# Patient Record
Sex: Female | Born: 1965 | Race: White | Hispanic: No | Marital: Married | Smoking: Never smoker
Health system: Southern US, Academic
[De-identification: ages and names within clinical notes are randomized; demographics above are authoritative.]

## PROBLEM LIST (undated history)

## (undated) DIAGNOSIS — R7303 Prediabetes: Secondary | ICD-10-CM

## (undated) DIAGNOSIS — Z464 Encounter for fitting and adjustment of orthodontic device: Secondary | ICD-10-CM

## (undated) DIAGNOSIS — IMO0002 Reserved for concepts with insufficient information to code with codable children: Secondary | ICD-10-CM

## (undated) DIAGNOSIS — R29810 Facial weakness: Secondary | ICD-10-CM

## (undated) DIAGNOSIS — G4730 Sleep apnea, unspecified: Secondary | ICD-10-CM

## (undated) DIAGNOSIS — I517 Cardiomegaly: Secondary | ICD-10-CM

## (undated) DIAGNOSIS — IMO0001 Reserved for inherently not codable concepts without codable children: Secondary | ICD-10-CM

## (undated) DIAGNOSIS — N35919 Unspecified urethral stricture, male, unspecified site: Secondary | ICD-10-CM

## (undated) DIAGNOSIS — I10 Essential (primary) hypertension: Secondary | ICD-10-CM

## (undated) DIAGNOSIS — T7840XA Allergy, unspecified, initial encounter: Secondary | ICD-10-CM

## (undated) DIAGNOSIS — T753XXA Motion sickness, initial encounter: Secondary | ICD-10-CM

## (undated) DIAGNOSIS — I82409 Acute embolism and thrombosis of unspecified deep veins of unspecified lower extremity: Secondary | ICD-10-CM

## (undated) DIAGNOSIS — M329 Systemic lupus erythematosus, unspecified: Secondary | ICD-10-CM

## (undated) DIAGNOSIS — I38 Endocarditis, valve unspecified: Secondary | ICD-10-CM

## (undated) DIAGNOSIS — Z973 Presence of spectacles and contact lenses: Secondary | ICD-10-CM

## (undated) DIAGNOSIS — K219 Gastro-esophageal reflux disease without esophagitis: Secondary | ICD-10-CM

## (undated) HISTORY — DX: Systemic lupus erythematosus, unspecified: M32.9

## (undated) HISTORY — DX: Unspecified urethral stricture, male, unspecified site: N35.919

## (undated) HISTORY — PX: CYST EXCISION: SHX5701

## (undated) HISTORY — DX: Endocarditis, valve unspecified: I38

## (undated) HISTORY — DX: Essential (primary) hypertension: I10

## (undated) HISTORY — DX: Cardiomegaly: I51.7

## (undated) HISTORY — DX: Reserved for concepts with insufficient information to code with codable children: IMO0002

## (undated) HISTORY — DX: Acute embolism and thrombosis of unspecified deep veins of unspecified lower extremity: I82.409

## (undated) HISTORY — PX: VULVA /PERINEUM BIOPSY: SHX319

## (undated) HISTORY — DX: Allergy, unspecified, initial encounter: T78.40XA

## (undated) HISTORY — PX: HX TONSILLECTOMY: SHX27

## (undated) HISTORY — PX: HX WISDOM TEETH EXTRACTION: SHX21

---

## 1986-01-15 DIAGNOSIS — O24419 Gestational diabetes mellitus in pregnancy, unspecified control: Secondary | ICD-10-CM

## 2000-09-02 ENCOUNTER — Encounter: Payer: Self-pay | Admitting: Internal Medicine

## 2000-09-02 ENCOUNTER — Emergency Department (HOSPITAL_COMMUNITY): Admission: EM | Admit: 2000-09-02 | Discharge: 2000-09-02 | Payer: Self-pay | Admitting: Internal Medicine

## 2001-08-08 ENCOUNTER — Emergency Department (HOSPITAL_COMMUNITY): Admission: EM | Admit: 2001-08-08 | Discharge: 2001-08-08 | Payer: Self-pay | Admitting: Emergency Medicine

## 2002-06-01 ENCOUNTER — Emergency Department (HOSPITAL_COMMUNITY): Admission: EM | Admit: 2002-06-01 | Discharge: 2002-06-01 | Payer: Self-pay | Admitting: Internal Medicine

## 2004-06-15 ENCOUNTER — Ambulatory Visit: Payer: Self-pay | Admitting: Internal Medicine

## 2004-06-15 ENCOUNTER — Ambulatory Visit: Payer: Self-pay | Admitting: Rheumatology

## 2004-07-05 ENCOUNTER — Ambulatory Visit: Payer: Self-pay | Admitting: Internal Medicine

## 2004-07-07 ENCOUNTER — Ambulatory Visit: Payer: Self-pay | Admitting: Otolaryngology

## 2004-08-05 ENCOUNTER — Ambulatory Visit: Payer: Self-pay | Admitting: Internal Medicine

## 2004-10-11 ENCOUNTER — Ambulatory Visit: Payer: Self-pay | Admitting: Internal Medicine

## 2004-11-30 ENCOUNTER — Ambulatory Visit: Payer: Self-pay | Admitting: Internal Medicine

## 2005-01-11 ENCOUNTER — Ambulatory Visit: Payer: Self-pay | Admitting: Internal Medicine

## 2005-02-04 ENCOUNTER — Ambulatory Visit: Payer: Self-pay | Admitting: Internal Medicine

## 2005-02-18 ENCOUNTER — Ambulatory Visit: Payer: Self-pay | Admitting: Internal Medicine

## 2005-03-18 ENCOUNTER — Ambulatory Visit: Payer: Self-pay | Admitting: Internal Medicine

## 2005-04-01 ENCOUNTER — Ambulatory Visit: Payer: Self-pay | Admitting: Internal Medicine

## 2005-04-21 ENCOUNTER — Ambulatory Visit: Payer: Self-pay | Admitting: Internal Medicine

## 2005-05-05 ENCOUNTER — Ambulatory Visit: Payer: Self-pay | Admitting: Internal Medicine

## 2005-06-05 ENCOUNTER — Ambulatory Visit: Payer: Self-pay | Admitting: Internal Medicine

## 2005-06-29 ENCOUNTER — Ambulatory Visit: Payer: Self-pay | Admitting: Otolaryngology

## 2005-11-14 ENCOUNTER — Ambulatory Visit: Payer: Self-pay | Admitting: Internal Medicine

## 2005-12-05 ENCOUNTER — Ambulatory Visit: Payer: Self-pay | Admitting: Internal Medicine

## 2006-01-25 ENCOUNTER — Ambulatory Visit: Payer: Self-pay | Admitting: Internal Medicine

## 2006-02-24 ENCOUNTER — Ambulatory Visit: Payer: Self-pay

## 2006-07-14 ENCOUNTER — Ambulatory Visit: Payer: Self-pay | Admitting: Otolaryngology

## 2007-03-05 ENCOUNTER — Ambulatory Visit: Payer: Self-pay

## 2008-03-05 ENCOUNTER — Ambulatory Visit: Payer: Self-pay

## 2008-05-02 ENCOUNTER — Ambulatory Visit: Payer: Self-pay | Admitting: Otolaryngology

## 2008-10-02 ENCOUNTER — Emergency Department: Payer: Self-pay | Admitting: Emergency Medicine

## 2008-11-21 ENCOUNTER — Ambulatory Visit: Payer: Self-pay | Admitting: Otolaryngology

## 2009-01-28 ENCOUNTER — Ambulatory Visit: Payer: Self-pay | Admitting: Internal Medicine

## 2009-03-09 ENCOUNTER — Ambulatory Visit: Payer: Self-pay

## 2009-08-13 ENCOUNTER — Emergency Department: Payer: Self-pay | Admitting: Emergency Medicine

## 2009-10-09 ENCOUNTER — Ambulatory Visit: Payer: Self-pay | Admitting: Otolaryngology

## 2009-12-03 ENCOUNTER — Emergency Department: Payer: Self-pay | Admitting: Emergency Medicine

## 2010-03-11 ENCOUNTER — Ambulatory Visit: Payer: Self-pay

## 2010-08-26 ENCOUNTER — Emergency Department: Payer: Self-pay | Admitting: Emergency Medicine

## 2010-09-15 ENCOUNTER — Ambulatory Visit: Payer: Self-pay | Admitting: Otolaryngology

## 2010-10-27 ENCOUNTER — Inpatient Hospital Stay: Payer: Self-pay | Admitting: Internal Medicine

## 2010-11-01 DIAGNOSIS — L500 Allergic urticaria: Secondary | ICD-10-CM | POA: Insufficient documentation

## 2010-11-01 DIAGNOSIS — T783XXA Angioneurotic edema, initial encounter: Secondary | ICD-10-CM | POA: Insufficient documentation

## 2011-04-13 ENCOUNTER — Ambulatory Visit: Payer: Self-pay

## 2011-10-24 ENCOUNTER — Emergency Department: Payer: Self-pay | Admitting: Unknown Physician Specialty

## 2011-10-24 LAB — COMPREHENSIVE METABOLIC PANEL
Albumin: 3.5 g/dL (ref 3.4–5.0)
Alkaline Phosphatase: 104 U/L (ref 50–136)
Anion Gap: 7 (ref 7–16)
BUN: 9 mg/dL (ref 7–18)
Bilirubin,Total: 0.2 mg/dL (ref 0.2–1.0)
Calcium, Total: 8.3 mg/dL — ABNORMAL LOW (ref 8.5–10.1)
Chloride: 107 mmol/L (ref 98–107)
Co2: 26 mmol/L (ref 21–32)
Creatinine: 0.95 mg/dL (ref 0.60–1.30)
EGFR (African American): >60
EGFR (Non-African Amer.): >60
Glucose: 110 mg/dL — ABNORMAL HIGH (ref 65–99)
Osmolality: 279 (ref 275–301)
Potassium: 3.4 mmol/L — ABNORMAL LOW (ref 3.5–5.1)
SGOT(AST): 29 U/L (ref 15–37)
SGPT (ALT): 20 U/L (ref 12–78)
Sodium: 140 mmol/L (ref 136–145)
Total Protein: 8.3 g/dL — ABNORMAL HIGH (ref 6.4–8.2)

## 2011-10-24 LAB — CBC
HCT: 35 % (ref 35.0–47.0)
HGB: 11.7 g/dL — ABNORMAL LOW (ref 12.0–16.0)
MCH: 29.6 pg (ref 26.0–34.0)
MCHC: 33.6 g/dL (ref 32.0–36.0)
MCV: 88 fL (ref 80–100)
Platelet: 151 10*3/uL (ref 150–440)
RBC: 3.97 10*6/uL (ref 3.80–5.20)
RDW: 15.5 % — ABNORMAL HIGH (ref 11.5–14.5)
WBC: 3.2 10*3/uL — ABNORMAL LOW (ref 3.6–11.0)

## 2011-10-24 LAB — PREGNANCY, URINE: Pregnancy Test, Urine: NEGATIVE m[IU]/mL

## 2011-10-24 LAB — URINALYSIS, COMPLETE
Leukocyte Esterase: NEGATIVE
Nitrite: NEGATIVE
Ph: 6 (ref 4.5–8.0)
Protein: NEGATIVE
RBC,UR: 108 /HPF (ref 0–5)
Specific Gravity: 1.021 (ref 1.003–1.030)
WBC UR: 1 /HPF (ref 0–5)

## 2011-10-24 LAB — MAGNESIUM: Magnesium: 1.6 mg/dL — ABNORMAL LOW

## 2011-10-24 LAB — LIPASE, BLOOD: Lipase: 225 U/L (ref 73–393)

## 2011-12-20 ENCOUNTER — Telehealth: Payer: Self-pay | Admitting: *Deleted

## 2012-01-19 NOTE — Telephone Encounter (Signed)
Patient had called to request a rx for diflucan

## 2012-04-11 ENCOUNTER — Ambulatory Visit: Payer: Self-pay

## 2012-06-13 DIAGNOSIS — M329 Systemic lupus erythematosus, unspecified: Secondary | ICD-10-CM | POA: Insufficient documentation

## 2012-10-15 ENCOUNTER — Ambulatory Visit: Payer: Self-pay

## 2012-11-05 ENCOUNTER — Ambulatory Visit: Payer: Self-pay

## 2012-12-05 ENCOUNTER — Ambulatory Visit: Payer: Self-pay

## 2012-12-15 ENCOUNTER — Emergency Department: Payer: Self-pay | Admitting: Emergency Medicine

## 2012-12-16 LAB — CBC WITH DIFFERENTIAL/PLATELET
Basophil #: 0 10*3/uL (ref 0.0–0.1)
Eosinophil #: 0.1 10*3/uL (ref 0.0–0.7)
Lymphocyte #: 1.2 10*3/uL (ref 1.0–3.6)
Lymphocyte %: 26.8 %
MCV: 85 fL (ref 80–100)
Platelet: 225 10*3/uL (ref 150–440)
RBC: 3.59 10*6/uL — ABNORMAL LOW (ref 3.80–5.20)
RDW: 13.4 % (ref 11.5–14.5)

## 2012-12-16 LAB — COMPREHENSIVE METABOLIC PANEL
Alkaline Phosphatase: 130 U/L (ref 50–136)
Anion Gap: 6 — ABNORMAL LOW (ref 7–16)
Bilirubin,Total: 0.2 mg/dL (ref 0.2–1.0)
Chloride: 111 mmol/L — ABNORMAL HIGH (ref 98–107)
Co2: 22 mmol/L (ref 21–32)
Creatinine: 0.83 mg/dL (ref 0.60–1.30)
EGFR (African American): >60
EGFR (Non-African Amer.): >60
Glucose: 96 mg/dL (ref 65–99)
SGPT (ALT): 20 U/L (ref 12–78)
Sodium: 139 mmol/L (ref 136–145)
Total Protein: 8.2 g/dL (ref 6.4–8.2)

## 2012-12-16 LAB — URINALYSIS, COMPLETE
Glucose,UR: NEGATIVE mg/dL (ref 0–75)
Ketone: NEGATIVE
Nitrite: NEGATIVE
Ph: 5 (ref 4.5–8.0)
Protein: 30
WBC UR: 126 /HPF (ref 0–5)

## 2012-12-16 LAB — CK TOTAL AND CKMB (NOT AT ARMC)
CK, Total: 135 U/L (ref 21–215)
CK-MB: 1.6 ng/mL (ref 0.5–3.6)

## 2013-03-18 ENCOUNTER — Ambulatory Visit: Payer: Self-pay | Admitting: Obstetrics and Gynecology

## 2013-03-18 LAB — CBC WITH DIFFERENTIAL/PLATELET
BASOS ABS: 0 10*3/uL (ref 0.0–0.1)
Basophil %: 0.4 %
EOS PCT: 1.4 %
Eosinophil #: 0.1 10*3/uL (ref 0.0–0.7)
HCT: 36.6 % (ref 35.0–47.0)
HGB: 12.4 g/dL (ref 12.0–16.0)
LYMPHS ABS: 0.9 10*3/uL — AB (ref 1.0–3.6)
Lymphocyte %: 20.8 %
MCH: 29.1 pg (ref 26.0–34.0)
MCHC: 34 g/dL (ref 32.0–36.0)
MCV: 86 fL (ref 80–100)
Monocyte #: 0.3 x10 3/mm (ref 0.2–0.9)
Monocyte %: 7.9 %
Neutrophil #: 3 10*3/uL (ref 1.4–6.5)
Neutrophil %: 69.5 %
PLATELETS: 230 10*3/uL (ref 150–440)
RBC: 4.27 10*6/uL (ref 3.80–5.20)
RDW: 14.9 % — AB (ref 11.5–14.5)
WBC: 4.2 10*3/uL (ref 3.6–11.0)

## 2013-03-18 LAB — BASIC METABOLIC PANEL
Anion Gap: 6 — ABNORMAL LOW (ref 7–16)
BUN: 15 mg/dL (ref 7–18)
CHLORIDE: 108 mmol/L — AB (ref 98–107)
CO2: 26 mmol/L (ref 21–32)
CREATININE: 0.81 mg/dL (ref 0.60–1.30)
Calcium, Total: 9.1 mg/dL (ref 8.5–10.1)
EGFR (African American): >60
EGFR (Non-African Amer.): >60
Glucose: 85 mg/dL (ref 65–99)
OSMOLALITY: 279 (ref 275–301)
POTASSIUM: 3.9 mmol/L (ref 3.5–5.1)
Sodium: 140 mmol/L (ref 136–145)

## 2013-03-18 LAB — APTT: Activated PTT: 32.9 secs (ref 23.6–35.9)

## 2013-03-18 LAB — PROTIME-INR
INR: 1.1
PROTHROMBIN TIME: 13.8 s (ref 11.5–14.7)

## 2013-03-28 ENCOUNTER — Ambulatory Visit: Payer: Self-pay | Admitting: Obstetrics and Gynecology

## 2013-03-29 LAB — PATHOLOGY REPORT

## 2013-07-09 ENCOUNTER — Ambulatory Visit: Payer: Self-pay | Admitting: Otolaryngology

## 2013-07-12 ENCOUNTER — Ambulatory Visit (INDEPENDENT_AMBULATORY_CARE_PROVIDER_SITE_OTHER): Payer: Managed Care, Other (non HMO)

## 2013-07-12 ENCOUNTER — Ambulatory Visit: Payer: Managed Care, Other (non HMO) | Admitting: Podiatry

## 2013-07-12 ENCOUNTER — Encounter: Payer: Self-pay | Admitting: Podiatry

## 2013-07-12 VITALS — BP 132/87 | HR 61 | Resp 16 | Ht 68.0 in | Wt 230.0 lb

## 2013-07-12 DIAGNOSIS — L84 Corns and callosities: Secondary | ICD-10-CM

## 2013-07-12 DIAGNOSIS — M79609 Pain in unspecified limb: Secondary | ICD-10-CM

## 2013-07-12 DIAGNOSIS — M2040 Other hammer toe(s) (acquired), unspecified foot: Secondary | ICD-10-CM

## 2013-07-12 NOTE — Progress Notes (Signed)
   Subjective:    Patient ID: Jacqueline [name], female    DOB: May 14, 1965, 48 y.o.   MRN: [id number]  HPI Comments: This little pinky toe has a painful corn on it. Cant wear closed in shoes      Review of Systems  Endocrine:       Diabetes  Allergic/Immunologic: Positive for environmental allergies.       Objective:   Physical Exam        Assessment & Plan:

## 2013-07-14 NOTE — Progress Notes (Signed)
Subjective:     Patient ID: Jacqueline [name], female   DOB: 1966/01/12, 48 y.o.   MRN: [id number]  HPI patient points to left fifth toe stating that it has been tender and she has trouble wearing shoe gear because of the pain. States it's been there for about 6 months   Review of Systems  All other systems reviewed and are negative.      Objective:   Physical Exam  Nursing note and vitals reviewed. Constitutional: She is oriented to person, place, and time.  Cardiovascular: Intact distal pulses.   Musculoskeletal: Normal range of motion.  Neurological: She is oriented to person, place, and time.  Skin: Skin is warm.   neurovascular status intact with keratotic lesion distal lateral aspect of fifth toe left it's painful when pressed and rotated fifth toe noted frontal plane. Muscle strength is adequate and range of motion is within normal limits     Assessment:     Rotated fifth toe causing distal lateral keratotic lesion it's painful on the toe    Plan:     H&P and x-ray reviewed and today deep debridable lesion accomplished with small amount of bleeding due to the thickness of the tissue with sterile dressing applied and instructions on soaks. Reappoint to read checked again when symptomatice

## 2013-07-21 ENCOUNTER — Emergency Department: Payer: Self-pay | Admitting: Emergency Medicine

## 2013-08-07 ENCOUNTER — Ambulatory Visit: Payer: Self-pay | Admitting: Urology

## 2013-08-15 ENCOUNTER — Emergency Department: Payer: Self-pay

## 2013-09-19 DIAGNOSIS — R591 Generalized enlarged lymph nodes: Secondary | ICD-10-CM | POA: Insufficient documentation

## 2013-09-19 NOTE — Progress Notes (Signed)
 Patient Name: Jacqueline [name] Patient Age: 48 y.o. Encounter Date: 09/19/2013  Attending Physician: Dr. Darice Fairy  Referring Physician:  Delon LITTIE Gosling, MD [address] [facility] HOS[facility] MAIL ROOM [place], [place] [address]  Primary Care Provider: Alm Needle, MD  Reason for Visit: Lymphadenopathy  HPI:  Jacqueline [name] is a 48 y.o. female who is seen in consultation at the request of Gosling Delon LITTIE, MD for evaluation for inguinal lymphadenopathy.  She has history of systemic lupus erythematosus.  She reports long-standing history of lymphadenopathy throughout her body.  She has undergone a cervical lymph node biopsy in the past which was found to be reactive.  She underwent a vulvectomy in January for a precancerous lesion.  She has recent history of frequent UTIs and underwent a CT scan of the abdomen and pelvis.  It noted persistent retroperitoneal and inguinal lymphadenopathy that is stable since CT scan in 2012.  She has not noticed enlarging inguinal lymph nodes.    Review of Systems: Denies fevers or chills. Denies headaches, dizziness, vision changes, or LOC.  Denies chest pain, shortness of breath, congestion, cough, or heart palpitations.  Denies weight loss. Denies abdominal pain, nausea, vomiting, diarrhea, constipation, melena or hematochezia.  Denies lower extremity edema.  Denies dysuria, hematuria, or urinary incontinence. Denies abnormal uterine bleeding.  She complains of intermittent hot flashes.  Denies focal bony pain. Remainder of 10 system review of systems is negative.    Allergies: is allergic to sulfa (sulfonamide antibiotics) and milk containing products.  Medications:  Reviewed in EPIC.  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 She has never used smokeless tobacco. She reports that she does not drink alcohol or use illicit drugs.  Vital Signs for this encounter: BSA: 2.24 meters squared BP 143/76   Pulse 80   Temp(Src) 36.6 C (97.9 F) (Oral)   Resp 20   Ht 170.2 cm (5' 7.01)   Wt 106.505 kg (234 lb 12.8 oz)   BMI 36.77 kg/m2     SpO2 99%  Wt Readings from Last 3 Encounters:  09/19/13 106.505 kg (234 lb 12.8 oz)  07/22/13 105.235 kg (232 lb)  07/03/13 106.7 kg (235 lb 3.7 oz)    General Appearance:  No acute distress, well appearing and well nourished.  Head:  Normocephalic, atraumatic.  Eyes:  Conjuctiva and lids appear normal. Pupils equal and round,  sclera anicteric.  Ears:  Overall appearance normal with no scars, lesions or masses.  Hearing is grossly normal.  Nose: Nares grossly normal, no drainage.  Throat: Lips, mucosa, and tongue normal; teeth and gums normal.  Neck: Supple, symmetrical, trachea midline, no adenopathy; thyroid :  no enlargement/tenderness/nodules.  Pulmonary:  Normal respiratory effort.  Lungs were clear to auscultation bilaterally.  Cardiovascular:  Regular rate and rhythm, no murmur noted.  No thrill noted.  Abdomen:   Soft, non-tender, without masses. No hepatosplenomegaly. No hernias appreciated. Normoactive bowel sounds.  Musculoskeletal: Normal gait.  Extremities without clubbing, cyanosis, or edema.  Skin: Skin color, texture, turgor normal, no rashes or lesions.  There is a dark, symmetrical nevi on the left lateral foot that is benign appearing.   Neurologic: Lymphatic: No motor abnormalities noted.  Sensation grossly intact. She has enlarged lymphadenopathy in cervical, bilateral axilla, and bilateral inguinal nodes.  These are non-tender to palpation, mobile.  Psychiatric: Judgement and insight appropriate.  Oriented to person, place, and time.   Diagnostic Studies:  CT abd/pelvis 08/07/13 Retroperitoneal lymphadenopathy measuring 1.2 cm at the level of the iliac artery bifurcation is reidentified.  Bilateral inguinal lymphadenopathy has again noted, largest 1.3 cm the left inguinal region.  These are compared to scans on 12/16/12 and 10/09/10.  Assessment: H/O SLE, longstanding lymphadenopathy  Plan: She was seen by Dr. Darice Fairy and myself today.   1.  We reviewed this CT imaging with the patient.  Based on CT report these appear stable from previous CT scans. 2.  Given that the lymphadenopathy is stable and long standing our suspicion for malignancy is very low.  However, immunosuppression increases her risk for malignancy. 3.  We had a lengthy discussion regarding biopsy vs surveillance.  Together, we have all agreed to observe the lymphadenopathy.  If any increase, she will return for biopsy. 4.  We also encouraged her to watch the nevi on her left foot.  If it enlarges this will need to be biopsied as well.   5.  She was encouraged to keep up with ongoing cancer surveillance.  She will be due for a colonoscopy at the age of 8.  She is up to date on her mammograms. 6.  She can follow up with us  on an as needed basis.  Our contact information was given for any further questions or concerns.  Dr. Darice Fairy was present for the evaluation and is in agreement with the plan above.

## 2014-05-27 ENCOUNTER — Ambulatory Visit: Payer: Managed Care, Other (non HMO) | Admitting: Podiatry

## 2014-06-24 ENCOUNTER — Ambulatory Visit (INDEPENDENT_AMBULATORY_CARE_PROVIDER_SITE_OTHER): Payer: Managed Care, Other (non HMO) | Admitting: Podiatry

## 2014-06-24 VITALS — BP 139/89 | HR 66 | Resp 16

## 2014-06-24 DIAGNOSIS — L84 Corns and callosities: Secondary | ICD-10-CM | POA: Diagnosis not present

## 2014-06-24 NOTE — Progress Notes (Signed)
Subjective:     Patient ID: Jacqueline [name], female   DOB: [birth year], 49 y.o.   MRN: [id number]  HPI patient presents with painful corns on both fifth toes   Review of Systems     Objective:   Physical Exam Neurovascular status intact with keratotic lesion fifth toe both feet that are painful    Assessment:     Lesion secondary to rotational component of fifth digits    Plan:     Debride lesion on both feet with no iatrogenic bleeding noted and reappoint as needed

## 2014-06-28 NOTE — Op Note (Signed)
PATIENT NAME:  Jacqueline [name], [name] MR#:  [id number] DATE OF BIRTH:  Jan 02, 1966  DATE OF PROCEDURE:  03/28/2013  PREOPERATIVE DIAGNOSIS: Vaginal intraepithelial neoplasia 3.   POSTOPERATIVE DIAGNOSIS:  Vaginal intraepithelial neoplasia 3.    OPERATION PERFORMED: Wide local excision.   ANESTHESIA USED:  MAC.   PRIMARY SURGEON: Florina Oundreas M. Bonney AidStaebler, MD   ESTIMATED BLOOD LOSS: Minimal.   OPERATIVE FLUIDS: 700 mL.   PREOPERATIVE ANTIBIOTICS: None.   COMPLICATIONS: None.   SPECIMENS REMOVED: Right excision of the right vulva with lateral margins taken after the excision.   OPERATIVE FINDINGS:  Small hypopigmented lesion in the right vulva at the site of the prior biopsy with minimal acetowhite changes noted upon application of acetic acid. A 3 x 4 cm ellipse was excised and following the excision lateral margin was taken superior and to the right of the previously-excised specimen.  Specimen removed at the right bulbar wide local excision.   CONDITION FOLLOWING PROCEDURE: Stable.   PROCEDURE IN DETAIL: Risks, benefits and alternatives of the procedure were discussed with the patient prior to proceeding to the Operating Room. The patient was taken to the Operating Room where she was placed under moderate sedation anesthesia. She was positioned in the dorsal lithotomy position using Allen stirrups. A timeout procedure was performed. Attention was turned to the patient's pelvis. Acetic acid was applied to the patient's vulva to look for acetowhite changes with minimal acetowhite changes noted around the site of the previous biopsy. A 3 to 4 cm ellipse was excised using the scalpel using a simple skinning vulvectomy technique. A lateral margin to the right was obtained secondary to concern of this being still within the previously-noted area of biopsy. Following this, the subcutaneous tissue was closed using 2-0 Vicryl subdermal sutures to take tension off the incision. Following placement of the 2-0  Vicryl sutures, skin was closed using a 4-0 Monocryl in subcuticular running fashion. Sponge, and instrument counts were correct x 2. The patient tolerated the procedure well and was taken to the recovery room in stable condition.     ____________________________ Florina OuAndreas M. Bonney AidStaebler, MD ams:cs D: 04/02/2013 17:28:24 ET T: 04/02/2013 19:09:24 ET JOB#: [id number]  cc: Florina OuAndreas M. Bonney AidStaebler, MD, <Dictator> Carmel SacramentoANDREAS Cathrine MusterM Sage Kopera MD ELECTRONICALLY SIGNED 04/24/2013 0:56

## 2014-10-03 DIAGNOSIS — E669 Obesity, unspecified: Secondary | ICD-10-CM | POA: Insufficient documentation

## 2014-12-04 ENCOUNTER — Emergency Department
Admission: EM | Admit: 2014-12-04 | Discharge: 2014-12-05 | Disposition: A | Discharge status: Home / Self Care | Payer: Managed Care, Other (non HMO) | Attending: Emergency Medicine | Admitting: Emergency Medicine

## 2014-12-04 DIAGNOSIS — Y998 Other external cause status: Secondary | ICD-10-CM | POA: Insufficient documentation

## 2014-12-04 DIAGNOSIS — X58XXXA Exposure to other specified factors, initial encounter: Secondary | ICD-10-CM | POA: Insufficient documentation

## 2014-12-04 DIAGNOSIS — Y9389 Activity, other specified: Secondary | ICD-10-CM | POA: Diagnosis not present

## 2014-12-04 DIAGNOSIS — Y9289 Other specified places as the place of occurrence of the external cause: Secondary | ICD-10-CM | POA: Diagnosis not present

## 2014-12-04 DIAGNOSIS — Z79899 Other long term (current) drug therapy: Secondary | ICD-10-CM | POA: Insufficient documentation

## 2014-12-04 DIAGNOSIS — E119 Type 2 diabetes mellitus without complications: Secondary | ICD-10-CM | POA: Diagnosis not present

## 2014-12-04 DIAGNOSIS — L509 Urticaria, unspecified: Secondary | ICD-10-CM

## 2014-12-04 DIAGNOSIS — I10 Essential (primary) hypertension: Secondary | ICD-10-CM | POA: Diagnosis not present

## 2014-12-04 DIAGNOSIS — T7840XA Allergy, unspecified, initial encounter: Secondary | ICD-10-CM | POA: Insufficient documentation

## 2014-12-04 DIAGNOSIS — T783XXA Angioneurotic edema, initial encounter: Secondary | ICD-10-CM | POA: Insufficient documentation

## 2014-12-04 MED ORDER — FAMOTIDINE IN NACL 20-0.9 MG/50ML-% IV SOLN
20.0000 mg | Freq: Once | INTRAVENOUS | Status: AC
  Administered 2014-12-04: 20 mg via INTRAVENOUS
  Filled 2014-12-04: qty 50

## 2014-12-04 MED ORDER — EPINEPHRINE 0.3 MG/0.3ML IJ SOAJ: 0.3000 mg | Freq: Once | INTRAMUSCULAR | Status: AC

## 2014-12-04 MED ORDER — DIPHENHYDRAMINE HCL 50 MG/ML IJ SOLN
INTRAMUSCULAR | Status: AC
  Filled 2014-12-04: qty 1

## 2014-12-04 MED ORDER — METHYLPREDNISOLONE SODIUM SUCC 125 MG IJ SOLR
125.0000 mg | Freq: Once | INTRAMUSCULAR | Status: AC
  Administered 2014-12-04: 125 mg via INTRAVENOUS

## 2014-12-04 MED ORDER — DIPHENHYDRAMINE HCL 50 MG/ML IJ SOLN
12.5000 mg | Freq: Once | INTRAMUSCULAR | Status: AC
  Administered 2014-12-04: 12.5 mg via INTRAVENOUS

## 2014-12-04 MED ORDER — EPINEPHRINE HCL 1 MG/ML IJ SOLN
INTRAMUSCULAR | Status: AC
  Administered 2014-12-04: 1 mg
  Filled 2014-12-04: qty 1

## 2014-12-04 MED ORDER — SODIUM CHLORIDE 0.9 % IV BOLUS (SEPSIS)
1000.0000 mL | Freq: Once | INTRAVENOUS | Status: AC
  Administered 2014-12-04: 1000 mL via INTRAVENOUS

## 2014-12-04 MED ORDER — METHYLPREDNISOLONE SODIUM SUCC 125 MG IJ SOLR
INTRAMUSCULAR | Status: AC
  Filled 2014-12-04: qty 2

## 2014-12-04 NOTE — ED Notes (Signed)
Pt in with co allergic reaction, lower lip swelling noted.

## 2014-12-04 NOTE — ED Notes (Addendum)
Pts bottom lip is currently swollen and pt reports allergy to meat. Pt is unsure if she ate meat tonight. Pt denies difficulty breathing and denies pain with swallowing. Pt has welts over back and wrapping around sides. Redness noted in areas with welts present.

## 2014-12-04 NOTE — ED Provider Notes (Signed)
[facility] Emergency Department Provider Note  ____________________________________________  Time seen: Approximately 11:23 PM  I have reviewed the triage vital signs and the nursing notes.   HISTORY  Chief Complaint Allergic Reaction    HPI Jacqueline [name] is a 49 y.o. female who presents to the ED from home with a chief complaint of acute allergic reaction. Patient has a history of alpha-gal and has an allergy to meat. Unsure if she came into contact with meat tonight.Onset of hives and lower lip swelling approximately 7 PM. Patient took 20 mg Pepcid and 75 mg of Benadryl over the course of the evening. When she lay down to sleep she felt like her throat was constricted and thus presents to the ED for evaluation. Patient has an EpiPen; however, did not use it prior to arrival. Denies other new exposures. Denies use of lisinopril. Denies recent fever, chills, chest pain, shortness of breath, abdominal pain, nausea, vomiting, diarrhea. Denies recent travel. Nothing makes the symptoms better or worse.   Past Medical History  Diagnosis Date  . Allergy   . Lupus   . Hypertension   . Diabetes mellitus without complication   . Leaky heart valve   . DVT (deep venous thrombosis)     history of     There are no active problems to display for this patient.   Past Surgical History  Procedure Laterality Date  . Cesarean section      Current Outpatient Rx  Name  Route  Sig  Dispense  Refill  . cetirizine (ZYRTEC) 10 MG tablet   Oral   Take 10 mg by mouth daily.         . Hydroxychloroquine Sulfate (PLAQUENIL PO)   Oral   Take by mouth.         . metoprolol succinate (TOPROL-XL) 25 MG 24 hr tablet   Oral   Take 25 mg by mouth daily.           Allergies Gelatin; Milk-related compounds; and Sulfa antibiotics  No family history on file.  Social History Social History  Substance Use Topics  . Smoking status: Never Smoker   . Smokeless  tobacco: Never Used  . Alcohol Use: No    Review of Systems Constitutional: No fever/chills Eyes: No visual changes. ENT: Positive for lower lip swelling. No sore throat. Cardiovascular: Denies chest pain. Respiratory: Denies shortness of breath. Gastrointestinal: No abdominal pain.  No nausea, no vomiting.  No diarrhea.  No constipation. Genitourinary: Negative for dysuria. Musculoskeletal: Negative for back pain. Skin: Positive for rash. Neurological: Negative for headaches, focal weakness or numbness.  10-point ROS otherwise negative.  ____________________________________________   PHYSICAL EXAM:  VITAL SIGNS: ED Triage Vitals  Enc Vitals Group     BP 12/04/14 2309 140/69 mmHg     Pulse Rate 12/04/14 2309 85     Resp --      Temp 12/04/14 2309 97.8 F (36.6 C)     Temp Source 12/04/14 2309 Oral     SpO2 12/04/14 2309 96 %     Weight 12/04/14 2309 215 lb (97.523 kg)     Height 12/04/14 2309  (1.702 m)     Head Cir --      Peak Flow --      Pain Score --      Pain Loc --      Pain Edu? --      Excl. in GC? --     Constitutional: Alert  and oriented. Well appearing and in no acute distress. Eyes: Conjunctivae are normal. PERRL. EOMI. Head: Atraumatic. Nose: No congestion/rhinnorhea. Mouth/Throat: Left side of lower lip with moderate swelling. No tongue swelling. No other signs of angioedema. Mucous membranes are moist.  Oropharynx non-erythematous. Neck: No stridor. No carotid bruits. Soft submental space. Cardiovascular: Normal rate, regular rhythm. Grossly normal heart sounds.  Good peripheral circulation. Respiratory: Normal respiratory effort.  No retractions. Lungs CTAB. Gastrointestinal: Soft and nontender. No distention. No abdominal bruits. No CVA tenderness. Musculoskeletal: No lower extremity tenderness nor edema.  No joint effusions. Neurologic:  Normal speech and language. No gross focal neurologic deficits are appreciated. No gait  instability. Skin:  Skin is warm, dry and intact. Mild generalized hives noted. Psychiatric: Mood and affect are normal. Speech and behavior are normal.  ____________________________________________   LABS (all labs ordered are listed, but only abnormal results are displayed)  Labs Reviewed - No data to display ____________________________________________  EKG  None ____________________________________________  RADIOLOGY  None ____________________________________________   PROCEDURES  Procedure(s) performed: None  Critical Care performed: No  ____________________________________________   INITIAL IMPRESSION / ASSESSMENT AND PLAN / ED COURSE  Pertinent labs & imaging results that were available during my care of the patient were reviewed by me and considered in my medical decision making (see chart for details).  49 year old female with acute allergic reaction. Will initiate treatment with IM epinephrine, IV Solu-Medrol, Pepcid and Benadryl. Will observe in the emergency department.  ----------------------------------------- 1:15 AM on 12/05/2014 -----------------------------------------  Patient sleeping in no acute distress. Swelling is not worse on the lower lip. Room air saturations 97%. Will continue to observe.  ----------------------------------------- 2:58 AM on 12/05/2014 -----------------------------------------  Swelling improved. Room air saturations 96%. Patient is still a little drowsy from IV Benadryl. She drove herself to the ED. Will continue to monitor. Anticipate discharge home once patient is more awake and able to drive home safely.   ----------------------------------------- 4:18 AM on 12/05/2014 -----------------------------------------  Patient awake, alert and able to drive home safely. No tongue or facial angioedema. Lip swelling improved. Room air saturations 97%. Strict return precautions given. Patient verbalizes understanding and  agrees plan of care. ____________________________________________   FINAL CLINICAL IMPRESSION(S) / ED DIAGNOSES  Final diagnoses:  Acute allergic reaction, initial encounter  Angioedema, initial encounter  Hives      Irean Hong, MD 12/05/14 [id number]

## 2014-12-05 DIAGNOSIS — T7840XA Allergy, unspecified, initial encounter: Secondary | ICD-10-CM | POA: Diagnosis not present

## 2014-12-05 MED ORDER — PREDNISONE 20 MG PO TABS: ORAL_TABLET | ORAL | Status: DC

## 2014-12-05 MED ORDER — FAMOTIDINE 20 MG PO TABS: 20.0000 mg | ORAL_TABLET | Freq: Two times a day (BID) | ORAL | Status: DC

## 2014-12-05 NOTE — Discharge Instructions (Signed)
1. Take the following medicines for the next 4 days: Prednisone 3m daily Pepcid 267mtwice daily 2. Take Benadryl as needed for itching. 3. Use your Epi-Pen in case of acute, life-threatening allergic reaction. 4. Return to the ER for worsening symptoms, persistent vomiting, difficulty breathing or other concerns.  Allergies Allergies may happen from anything your body is sensitive to. This may be food, medicines, pollens, chemicals, and nearly anything around you in everyday life that produces allergens. An allergen is anything that causes an allergy producing substance. Heredity is often a factor in causing these problems. This means you may have some of the same allergies as your parents. Food allergies happen in all age groups. Food allergies are some of the most severe and life threatening. Some common food allergies are cow's milk, seafood, eggs, nuts, wheat, and soybeans. SYMPTOMS   Swelling around the mouth.  An itchy red rash or hives.  Vomiting or diarrhea.  Difficulty breathing. SEVERE ALLERGIC REACTIONS ARE LIFE-THREATENING. This reaction is called anaphylaxis. It can cause the mouth and throat to swell and cause difficulty with breathing and swallowing. In severe reactions only a trace amount of food (for example, peanut oil in a salad) may cause death within seconds. Seasonal allergies occur in all age groups. These are seasonal because they usually occur during the same season every year. They may be a reaction to molds, grass pollens, or tree pollens. Other causes of problems are house dust mite allergens, pet dander, and mold spores. The symptoms often consist of nasal congestion, a runny itchy nose associated with sneezing, and tearing itchy eyes. There is often an associated itching of the mouth and ears. The problems happen when you come in contact with pollens and other allergens. Allergens are the particles in the air that the body reacts to with an allergic reaction.  This causes you to release allergic antibodies. Through a chain of events, these eventually cause you to release histamine into the blood stream. Although it is meant to be protective to the body, it is this release that causes your discomfort. This is why you were given anti-histamines to feel better. If you are unable to pinpoint the offending allergen, it may be determined by skin or blood testing. Allergies cannot be cured but can be controlled with medicine. Hay fever is a collection of all or some of the seasonal allergy problems. It may often be treated with simple over-the-counter medicine such as diphenhydramine. Take medicine as directed. Do not drink alcohol or drive while taking this medicine. Check with your caregiver or package insert for child dosages. If these medicines are not effective, there are many new medicines your caregiver can prescribe. Stronger medicine such as nasal spray, eye drops, and corticosteroids may be used if the first things you try do not work well. Other treatments such as immunotherapy or desensitizing injections can be used if all else fails. Follow up with your caregiver if problems continue. These seasonal allergies are usually not life threatening. They are generally more of a nuisance that can often be handled using medicine. HOME CARE INSTRUCTIONS   If unsure what causes a reaction, keep a diary of foods eaten and symptoms that follow. Avoid foods that cause reactions.  If hives or rash are present:  Take medicine as directed.  You may use an over-the-counter antihistamine (diphenhydramine) for hives and itching as needed.  Apply cold compresses (cloths) to the skin or take baths in cool water. Avoid hot baths or showers. Heat  will make a rash and itching worse.  If you are severely allergic:  Following a treatment for a severe reaction, hospitalization is often required for closer follow-up.  Wear a medic-alert bracelet or necklace stating the  allergy.  You and your family must learn how to give adrenaline or use an anaphylaxis kit.  If you have had a severe reaction, always carry your anaphylaxis kit or EpiPen with you. Use this medicine as directed by your caregiver if a severe reaction is occurring. Failure to do so could have a fatal outcome. SEEK MEDICAL CARE IF:  You suspect a food allergy. Symptoms generally happen within 30 minutes of eating a food.  Your symptoms have not gone away within 2 days or are getting worse.  You develop new symptoms.  You want to retest yourself or your child with a food or drink you think causes an allergic reaction. Never do this if an anaphylactic reaction to that food or drink has happened before. Only do this under the care of a caregiver. SEEK IMMEDIATE MEDICAL CARE IF:   You have difficulty breathing, are wheezing, or have a tight feeling in your chest or throat.  You have a swollen mouth, or you have hives, swelling, or itching all over your body.  You have had a severe reaction that has responded to your anaphylaxis kit or an EpiPen. These reactions may return when the medicine has worn off. These reactions should be considered life threatening. MAKE SURE YOU:   Understand these instructions.  Will watch your condition.  Will get help right away if you are not doing well or get worse. Document Released: 05/17/2002 Document Revised: 06/18/2012 Document Reviewed: 10/22/2007 [facility] Patient Information 2015 [place], [place]. This information is not intended to replace advice given to you by your health care provider. Make sure you discuss any questions you have with your health care provider.  Angioedema Angioedema is a sudden swelling of tissues, often of the skin. It can occur on the face or genitals or in the abdomen or other body parts. The swelling usually develops over a short period and gets better in 24 to 48 hours. It often begins during the night and is found when the  person wakes up. The person may also get red, itchy patches of skin (hives). Angioedema can be dangerous if it involves swelling of the air passages.  Depending on the cause, episodes of angioedema may only happen once, come back in unpredictable patterns, or repeat for several years and then gradually fade away.  CAUSES  Angioedema can be caused by an allergic reaction to various triggers. It can also result from nonallergic causes, including reactions to drugs, immune system disorders, viral infections, or an abnormal gene that is passed to you from your parents (hereditary). For some people with angioedema, the cause is unknown.  Some things that can trigger angioedema include:   Foods.   Medicines, such as ACE inhibitors, ARBs, nonsteroidal anti-inflammatory agents, or estrogen.   Latex.   Animal saliva.   Insect stings.   Dyes used in X-rays.   Mild injury.   Dental work.  Surgery.  Stress.   Sudden changes in temperature.   Exercise. SIGNS AND SYMPTOMS   Swelling of the skin.  Hives. If these are present, there is also intense itching.  Redness in the affected area.   Pain in the affected area.  Swollen lips or tongue.  Breathing problems. This may happen if the air passages swell.  Wheezing. If  internal organs are involved, there may be:   Nausea.   Abdominal pain.   Vomiting.   Difficulty swallowing.   Difficulty passing urine. DIAGNOSIS   Your health care provider will examine the affected area and take a medical and family history.  Various tests may be done to help determine the cause. Tests may include:  Allergy skin tests to see if the problem is an allergic reaction.   Blood tests to check for hereditary angioedema.   Tests to check for underlying diseases that could cause the condition.   A review of your medicines, including over-the-counter medicines, may be done. TREATMENT  Treatment will depend on the cause of  the angioedema. Possible treatments include:   Removal of anything that triggered the condition (such as stopping certain medicines).   Medicines to treat symptoms or prevent attacks. Medicines given may include:   Antihistamines.   Epinephrine injection.   Steroids.   Hospitalization may be required for severe attacks. If the air passages are affected, it can be an emergency. Tubes may need to be placed to keep the airway open. HOME CARE INSTRUCTIONS   Take all medicines as directed by your health care provider.  If you were given medicines for emergency allergy treatment, always carry them with you.  Wear a medical bracelet as directed by your health care provider.   Avoid known triggers. SEEK MEDICAL CARE IF:   You have repeat attacks of angioedema.   Your attacks are more frequent or more severe despite preventive measures.   You have hereditary angioedema and are considering having children. It is important to discuss with your health care provider the risks of passing the condition on to your children. SEEK IMMEDIATE MEDICAL CARE IF:   You have severe swelling of the mouth, tongue, or lips.  You have difficulty breathing.   You have difficulty swallowing.   You faint. MAKE SURE YOU:  Understand these instructions.  Will watch your condition.  Will get help right away if you are not doing well or get worse. Document Released: 05/02/2001 Document Revised: 07/08/2013 Document Reviewed: 10/15/2012 [facility] Patient Information 2015 [place], [place]. This information is not intended to replace advice given to you by your health care provider. Make sure you discuss any questions you have with your health care provider.  Hives Hives are itchy, red, swollen areas of the skin. They can vary in size and location on your body. Hives can come and go for hours or several days (acute hives) or for several weeks (chronic hives). Hives do not spread from person to  person (noncontagious). They may get worse with scratching, exercise, and emotional stress. CAUSES   Allergic reaction to food, additives, or drugs.  Infections, including the common cold.  Illness, such as vasculitis, lupus, or thyroid disease.  Exposure to sunlight, heat, or cold.  Exercise.  Stress.  Contact with chemicals. SYMPTOMS   Red or white swollen patches on the skin. The patches may change size, shape, and location quickly and repeatedly.  Itching.  Swelling of the hands, feet, and face. This may occur if hives develop deeper in the skin. DIAGNOSIS  Your caregiver can usually tell what is wrong by performing a physical exam. Skin or blood tests may also be done to determine the cause of your hives. In some cases, the cause cannot be determined. TREATMENT  Mild cases usually get better with medicines such as antihistamines. Severe cases may require an emergency epinephrine injection. If the cause of your  hives is known, treatment includes avoiding that trigger.  HOME CARE INSTRUCTIONS   Avoid causes that trigger your hives.  Take antihistamines as directed by your caregiver to reduce the severity of your hives. Non-sedating or low-sedating antihistamines are usually recommended. Do not drive while taking an antihistamine.  Take any other medicines prescribed for itching as directed by your caregiver.  Wear loose-fitting clothing.  Keep all follow-up appointments as directed by your caregiver. SEEK MEDICAL CARE IF:   You have persistent or severe itching that is not relieved with medicine.  You have painful or swollen joints. SEEK IMMEDIATE MEDICAL CARE IF:   You have a fever.  Your tongue or lips are swollen.  You have trouble breathing or swallowing.  You feel tightness in the throat or chest.  You have abdominal pain. These problems may be the first sign of a life-threatening allergic reaction. Call your local emergency services (911 in [place] MAKE  SURE YOU:   Understand these instructions.  Will watch your condition.  Will get help right away if you are not doing well or get worse. Document Released: 02/21/2005 Document Revised: 02/26/2013 Document Reviewed: 05/17/2011 [facility] Patient Information 2015 [place], [place]. This information is not intended to replace advice given to you by your health care provider. Make sure you discuss any questions you have with your health care provider.

## 2014-12-11 DIAGNOSIS — Z91018 Allergy to other foods: Secondary | ICD-10-CM | POA: Insufficient documentation

## 2015-02-13 ENCOUNTER — Encounter: Payer: Self-pay | Admitting: Urology

## 2015-02-13 ENCOUNTER — Ambulatory Visit (INDEPENDENT_AMBULATORY_CARE_PROVIDER_SITE_OTHER): Payer: Managed Care, Other (non HMO) | Admitting: Urology

## 2015-02-13 VITALS — BP 137/87 | HR 76 | Ht 67.5 in | Wt 216.5 lb

## 2015-02-13 DIAGNOSIS — N323 Diverticulum of bladder: Secondary | ICD-10-CM

## 2015-02-13 DIAGNOSIS — N3592 Unspecified urethral stricture, female: Secondary | ICD-10-CM

## 2015-02-13 DIAGNOSIS — N359 Urethral stricture, unspecified: Secondary | ICD-10-CM

## 2015-02-13 LAB — URINALYSIS, COMPLETE
BILIRUBIN UA: NEGATIVE
Glucose, UA: NEGATIVE
Ketones, UA: NEGATIVE
LEUKOCYTES UA: NEGATIVE
Nitrite, UA: NEGATIVE
PH UA: 7 (ref 5.0–7.5)
PROTEIN UA: NEGATIVE
SPEC GRAV UA: 1.025 (ref 1.005–1.030)
UUROB: 0.2 mg/dL (ref 0.2–1.0)

## 2015-02-13 LAB — MICROSCOPIC EXAMINATION: Bacteria, UA: NONE SEEN

## 2015-02-13 LAB — BLADDER SCAN AMB NON-IMAGING: SCAN RESULT: 63

## 2015-02-13 NOTE — Progress Notes (Signed)
02/13/2015 4:33 PM   Jacqueline [name] 11/22/1965 [id number]  Referring provider: Clydie Braun, MD [address] [facility] [address] [facility] - INFECTIOUS DISEASE [place], [place] [address]  Chief Complaint  Patient presents with  . urethral stricture    1 year follow up    HPI: 49 year old female who presents 1 year follow-up of urethral stricture, bladder outlet obstruction. She initially presented with complaints including weak urinary stream, difficulty emptying her bladder, spraying of her urinary stream. She was noted to have the urethral stricture with the presence of a wide mouth bladder diverticulum. She underwent office urethral dilation (up to 22 [place]) with complete resolution of her symptoms.  Today, she reports that she continues to very well. She no longer has her obstructive voiding symptoms that she experienced prior to the dilation. No urinary tract infections. No dysuria. She is able to empty her bladder easily. Her urinary flow was excellent.  Postvoid residual today less than 100 cc.   PMH: Past Medical History  Diagnosis Date  . Allergy   . Lupus (HCC)   . Hypertension   . Diabetes mellitus without complication (HCC)   . Leaky heart valve   . DVT (deep venous thrombosis) (HCC)     history of   . Urethral stricture     Surgical History: Past Surgical History  Procedure Laterality Date  . Cesarean section      Home Medications:    Medication List       This list is accurate as of: 02/13/15  4:33 PM.  Always use your most recent med list.               azaTHIOprine 50 MG tablet  Commonly known as:  IMURAN  Take by mouth.     BENZEPRO SHORT CONTACT 9.8 % Foam  Generic drug:  Benzoyl Peroxide  Apply topically daily as needed (use under arms).     cetirizine 10 MG tablet  Commonly known as:  ZYRTEC  Take 10 mg by mouth daily.     diphenhydrAMINE 25 mg capsule  Commonly known as:  BENADRYL  Take 25 mg by mouth.     EPIPEN 2-PAK 0.3  mg/0.3 mL Soaj injection  Generic drug:  EPINEPHrine  Inject 0.3 mg into the muscle.     famotidine 20 MG tablet  Commonly known as:  PEPCID  Take 1 tablet (20 mg total) by mouth 2 (two) times daily.     metoprolol succinate 25 MG 24 hr tablet  Commonly known as:  TOPROL-XL  Take 25 mg by mouth daily.     metoprolol tartrate 25 MG tablet  Commonly known as:  LOPRESSOR  Take 12.5 mg by mouth.     PLAQUENIL PO  Take by mouth.     predniSONE 20 MG tablet  Commonly known as:  DELTASONE  3 tablets daily 4 days     ranitidine 150 MG tablet  Commonly known as:  ZANTAC  Take by mouth.        Allergies:  Allergies  Allergen Reactions  . Ciprofloxacin Swelling  . Doxycycline Nausea Only and Other (See Comments)    GI Upset  . Gelatin     Alpha gal  . Milk-Related Compounds     Alpha gal  . Sulfa Antibiotics Hives    Family History: Family History  Problem Relation Age of Onset  . Kidney disease Mother     only one kidney from accident at childhood  . Bladder Cancer  Neg Hx     Social History:  reports that she has never smoked. She has never used smokeless tobacco. She reports that she does not drink alcohol or use illicit drugs.  ROS: UROLOGY Frequent Urination?: Yes Hard to postpone urination?: No Burning/pain with urination?: No Get up at night to urinate?: Yes Leakage of urine?: No Urine stream starts and stops?: No Trouble starting stream?: No Do you have to strain to urinate?: No Blood in urine?: No Urinary tract infection?: No Sexually transmitted disease?: No Injury to kidneys or bladder?: No Painful intercourse?: No Weak stream?: No Currently pregnant?: No Vaginal bleeding?: No Last menstrual period?: n  Gastrointestinal Nausea?: No Vomiting?: No Indigestion/heartburn?: No Diarrhea?: No Constipation?: No  Constitutional Fever: No Night sweats?: No Weight loss?: No Fatigue?: No  Skin Skin rash/lesions?: No Itching?:  No  Eyes Blurred vision?: No Double vision?: No  Ears/Nose/Throat Sore throat?: No Sinus problems?: No  Hematologic/Lymphatic Swollen glands?: Yes Easy bruising?: Yes  Cardiovascular Leg swelling?: No Chest pain?: No  Respiratory Cough?: No Shortness of breath?: No  Endocrine Excessive thirst?: No  Musculoskeletal Back pain?: No Joint pain?: No  Neurological Headaches?: No Dizziness?: No  Psychologic Depression?: No Anxiety?: No  Physical Exam: BP 137/87 mmHg  Pulse 76  Ht 5' 7.5" (1.715 m)  Wt 216 lb 8 oz (98.204 kg)  BMI 33.39 kg/m2  Constitutional:  Alert and oriented, No acute distress. HEENT: [place] AT, moist mucus membranes.  Trachea midline, no masses. Cardiovascular: No clubbing, cyanosis, or edema. Respiratory: Normal respiratory effort, no increased work of breathing. GI: Abdomen is soft, nontender, nondistended, no abdominal masses GU: No CVA tenderness.  Skin: No rashes, bruises or suspicious lesions. Neurologic: Grossly intact, no focal deficits, moving all 4 extremities. Psychiatric: Normal mood and affect.  Laboratory Data: Lab Results  Component Value Date   WBC 4.2 03/18/2013   HGB 12.4 03/18/2013   HCT 36.6 03/18/2013   MCV 86 03/18/2013   PLT 230 03/18/2013    Lab Results  Component Value Date   CREATININE 0.81 03/18/2013    Urinalysis Urine dip positive only for trace blood today, microscopic exam reveals no white blood cells or red blood cells. There is mucus present but no bacteria.  Pertinent Imaging: PVR 63 cc  Assessment & Plan:    1. 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 She had no recurrence of her symptoms therefore no need for further intervention at this time. We did review today symptoms related to stricture recurrence and she is agreed to follow up as needed. She does appear to be emptying her  bladder well with relatively low post void residual. 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 No symptoms of ongoing bladder outlet obstruction. No sequela from diverticulum. No intervention at this time.   Return if symptoms worsen or fail to improve.  Vanna ScotlandAshley Sumedha Munnerlyn, MD  [facility] [address], [address] [place], [place] [address] [phone]

## 2015-04-24 DIAGNOSIS — Z8619 Personal history of other infectious and parasitic diseases: Secondary | ICD-10-CM | POA: Insufficient documentation

## 2015-04-24 DIAGNOSIS — B0229 Other postherpetic nervous system involvement: Secondary | ICD-10-CM | POA: Insufficient documentation

## 2015-09-22 ENCOUNTER — Other Ambulatory Visit: Payer: Self-pay | Admitting: Otolaryngology

## 2015-09-22 DIAGNOSIS — R599 Enlarged lymph nodes, unspecified: Secondary | ICD-10-CM

## 2015-09-25 ENCOUNTER — Ambulatory Visit
Admission: RE | Admit: 2015-09-25 | Discharge: 2015-09-25 | Disposition: A | Discharge status: Home / Self Care | Payer: Managed Care, Other (non HMO) | Source: Ambulatory Visit | Attending: Otolaryngology | Admitting: Otolaryngology

## 2015-09-25 DIAGNOSIS — R221 Localized swelling, mass and lump, neck: Secondary | ICD-10-CM | POA: Diagnosis present

## 2015-09-25 DIAGNOSIS — R599 Enlarged lymph nodes, unspecified: Secondary | ICD-10-CM

## 2015-09-29 ENCOUNTER — Other Ambulatory Visit: Payer: Self-pay | Admitting: Otolaryngology

## 2015-09-30 ENCOUNTER — Other Ambulatory Visit: Payer: Self-pay | Admitting: Otolaryngology

## 2015-09-30 ENCOUNTER — Telehealth: Payer: Self-pay | Admitting: *Deleted

## 2015-09-30 DIAGNOSIS — R599 Enlarged lymph nodes, unspecified: Secondary | ICD-10-CM

## 2015-10-08 ENCOUNTER — Other Ambulatory Visit: Payer: Self-pay | Admitting: Physician Assistant

## 2015-10-09 ENCOUNTER — Ambulatory Visit
Admission: RE | Admit: 2015-10-09 | Discharge: 2015-10-09 | Disposition: A | Discharge status: Home / Self Care | Payer: Managed Care, Other (non HMO) | Source: Ambulatory Visit | Attending: Otolaryngology | Admitting: Otolaryngology

## 2015-10-09 DIAGNOSIS — R591 Generalized enlarged lymph nodes: Secondary | ICD-10-CM | POA: Diagnosis present

## 2015-10-09 DIAGNOSIS — R221 Localized swelling, mass and lump, neck: Secondary | ICD-10-CM | POA: Insufficient documentation

## 2015-10-09 DIAGNOSIS — R599 Enlarged lymph nodes, unspecified: Secondary | ICD-10-CM

## 2015-10-09 NOTE — OR Nursing (Signed)
Dr Grace Isaac reviewed lymph node, discussed findings with patient. Plan to proceed with biopsy. No IV, sedation of pre procedure labs needed. [facility] available

## 2015-10-09 NOTE — Procedures (Signed)
Technically successful US guided biopsy of dominant right submandibular lymph node.   EBL: None No immediate complications.   Katherina Right, MD Pager #: [phone]

## 2015-10-09 NOTE — Discharge Instructions (Signed)
Thyroid Biopsy, Care After Refer to this sheet in the next few weeks. These instructions provide you with information on caring for yourself after your procedure. Your health care provider may also give you more specific instructions. Your treatment has been planned according to current medical practices, but problems sometimes occur. Call your health care provider if you have any problems or questions after your procedure.  WHAT TO EXPECT AFTER THE PROCEDURE After your procedure, it is typical to have the following:  You may have soreness and tenderness at the biopsy site for a few days.  Apply ice pack to neck hourly for about 5 minutes to reduce swelling and pain, while awake HOME CARE INSTRUCTIONS  Take medicines only as directed by your health care provider.  To ease discomfort at the biopsy site:  Keep your head raised on a pillow when you are lying down.  Support the back of your head and neck with both hands as you sit up from a lying position.   If you have a sore throat, try using throat lozenges or gargling with warm salt water.   Keep all follow-up visits as directed by your health care provider. This is important. SEEK MEDICAL CARE IF:  You have a fever. SEEK IMMEDIATE MEDICAL CARE IF:  You have severe bleeding from the biopsy site.   You have difficulty swallowing.   You have drainage, redness, swelling, or pain at the biopsy site.   You have swollen glands (lymph nodes) in your neck.    This information is not intended to replace advice given to you by your health care provider. Make sure you discuss any questions you have with your health care provider.   Document Released: 09/18/2013 Document Reviewed: 09/18/2013 Elsevier Interactive Patient Education [employer]

## 2015-10-12 LAB — CYTOLOGY - NON PAP

## 2015-10-30 ENCOUNTER — Telehealth: Payer: Self-pay | Admitting: Urology

## 2015-10-30 NOTE — Telephone Encounter (Signed)
Spoke with pt in reference to medications. Pt was wanting to know what medications she was allergic to. Pt stated that she had to go to the urgent care last night due to s/s. Pt stated that she made an appt in September but was not able to make it that long.

## 2015-11-04 ENCOUNTER — Encounter: Payer: Self-pay | Admitting: Otolaryngology

## 2015-11-20 ENCOUNTER — Ambulatory Visit (INDEPENDENT_AMBULATORY_CARE_PROVIDER_SITE_OTHER): Payer: Managed Care, Other (non HMO) | Admitting: Urology

## 2015-11-20 ENCOUNTER — Encounter: Payer: Self-pay | Admitting: Urology

## 2015-11-20 VITALS — BP 131/84 | HR 78 | Ht 68.0 in | Wt 216.0 lb

## 2015-11-20 DIAGNOSIS — L409 Psoriasis, unspecified: Secondary | ICD-10-CM | POA: Insufficient documentation

## 2015-11-20 DIAGNOSIS — Z113 Encounter for screening for infections with a predominantly sexual mode of transmission: Secondary | ICD-10-CM

## 2015-11-20 DIAGNOSIS — N3001 Acute cystitis with hematuria: Secondary | ICD-10-CM

## 2015-11-20 DIAGNOSIS — N35028 Other post-traumatic urethral stricture, female: Secondary | ICD-10-CM

## 2015-11-20 DIAGNOSIS — M329 Systemic lupus erythematosus, unspecified: Secondary | ICD-10-CM | POA: Insufficient documentation

## 2015-11-20 DIAGNOSIS — R350 Frequency of micturition: Secondary | ICD-10-CM | POA: Diagnosis not present

## 2015-11-20 DIAGNOSIS — N390 Urinary tract infection, site not specified: Secondary | ICD-10-CM | POA: Insufficient documentation

## 2015-11-20 LAB — MICROSCOPIC EXAMINATION

## 2015-11-20 LAB — URINALYSIS, COMPLETE
BILIRUBIN UA: NEGATIVE
Glucose, UA: NEGATIVE
KETONES UA: NEGATIVE
NITRITE UA: NEGATIVE
Protein, UA: NEGATIVE
SPEC GRAV UA: 1.01 (ref 1.005–1.030)
Urobilinogen, Ur: 0.2 mg/dL (ref 0.2–1.0)
pH, UA: 6.5 (ref 5.0–7.5)

## 2015-11-20 LAB — BLADDER SCAN AMB NON-IMAGING: Scan Result: 81

## 2015-11-20 MED ORDER — OXYBUTYNIN CHLORIDE 5 MG PO TABS: 5.0000 mg | ORAL_TABLET | Freq: Three times a day (TID) | ORAL | 0 refills | Status: DC | PRN

## 2015-11-20 NOTE — Progress Notes (Signed)
11/20/2015 8:27 PM   Jacqueline BibleLisa Y [name] 1966/02/14 [id number]  Referring provider: Mick Sellavid P Fitzgerald, MD [address] [facility] [place], [place] [id number]  Chief Complaint  Patient presents with  . 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   She initially presented with complaints including weak urinary stream, difficulty emptying her bladder, spraying of her urinary stream. She was noted to have the urethral stricture with the presence of a wide mouth bladder diverticulum. She underwent office urethral dilation (up to 22 [place]) with complete resolution of her symptoms ~1.5 years ago.  She did well after this procedure without recurrent urinary tract infections until 3 weeks ago.  She was evaluated 3 weeks ago for dysuria, urgency/ frequency at which time she was treated for +UA from [facility] on 10/30/15, UCx without growth.  She was treated for a UTI with Keflex 500 mg for 7 days.  She has not any any issues or infections prior to this since his last visit.    She reports that she is feeling much better overall except she is still having urinary urgency and nocturia.    She has had several follow up UA with her rheumatologist who is concerned she may have an ongoing infection.    Her partner has recently been unfaithful and she is concerned that she may have a sexually transmitted infection.   No pelvic pain or vaginal discharge.   PVR today 81 cc.  UA today shows 6-10 WBC, otherwise negative.     PMH: Past Medical History:  Diagnosis Date  . Allergy   . Diabetes mellitus without complication (HCC)   . DVT (deep venous thrombosis) (HCC)    history of   . Hypertension   . Leaky heart valve   . Lupus (HCC)   . Urethral stricture     Surgical History: Past Surgical History:  Procedure Laterality Date  . CESAREAN  SECTION      Home Medications:    Medication List       Accurate as of 11/20/15 11:59 PM. Always use your most recent med list.          cetirizine 10 MG tablet Commonly known as:  ZYRTEC Take 10 mg by mouth daily.   diphenhydrAMINE 25 mg capsule Commonly known as:  BENADRYL Take 25 mg by mouth.   EPIPEN 2-PAK 0.3 mg/0.3 mL Soaj injection Generic drug:  EPINEPHrine Inject 0.3 mg into the muscle.   metoprolol succinate 25 MG 24 hr tablet Commonly known as:  TOPROL-XL Take 25 mg by mouth daily.   oxybutynin 5 MG tablet Commonly known as:  DITROPAN Take 1 tablet (5 mg total) by mouth every 8 (eight) hours as needed for bladder spasms.   PLAQUENIL PO Take by mouth.       Allergies:  Allergies  Allergen Reactions  . Ciprofloxacin Swelling  . Doxycycline Nausea Only and Other (See Comments)    GI Upset  . Gelatin     Alpha gal  . Milk-Related Compounds     Alpha gal  . Sulfa Antibiotics Hives    Family History: Family History  Problem Relation Age of Onset  . Kidney disease Mother     only one kidney from accident at childhood  . Bladder Cancer Neg Hx     Social History:  reports that she has never smoked. She has never  used smokeless tobacco. She reports that she does not drink alcohol or use drugs.  ROS: UROLOGY Frequent Urination?: Yes Hard to postpone urination?: No Burning/pain with urination?: Yes Get up at night to urinate?: Yes Leakage of urine?: No Urine stream starts and stops?: No Trouble starting stream?: No Do you have to strain to urinate?: No Blood in urine?: No Urinary tract infection?: Yes Sexually transmitted disease?: No Injury to kidneys or bladder?: No Painful intercourse?: No Weak stream?: No Currently pregnant?: No Vaginal bleeding?: No Last menstrual period?: n  Gastrointestinal Nausea?: No Vomiting?: No Indigestion/heartburn?: No Diarrhea?: No Constipation?: No  Constitutional Fever: No Night sweats?:  No Weight loss?: No Fatigue?: No  Skin Skin rash/lesions?: No Itching?: No  Eyes Blurred vision?: No Double vision?: No  Ears/Nose/Throat Sore throat?: No Sinus problems?: No  Hematologic/Lymphatic Swollen glands?: Yes Easy bruising?: No  Cardiovascular Leg swelling?: No Chest pain?: No  Respiratory Cough?: No Shortness of breath?: No  Endocrine Excessive thirst?: No  Musculoskeletal Back pain?: No Joint pain?: No  Neurological Headaches?: No Dizziness?: No  Psychologic Depression?: No Anxiety?: No  Physical Exam: BP 131/84   Pulse 78   Ht 5\' 8"  (1.727 m)   Wt 216 lb (98 kg)   BMI 32.84 kg/m   Constitutional:  Alert and oriented, No acute distress. HEENT: [place] AT, moist mucus membranes.  Trachea midline, no masses. Cardiovascular: No clubbing, cyanosis, or edema. Respiratory: Normal respiratory effort, no increased work of breathing. GI: Abdomen is soft, nontender, nondistended, no abdominal masses GU: No CVA tenderness.  Skin: No rashes, bruises or suspicious lesions. Neurologic: Grossly intact, no focal deficits, moving all 4 extremities. Psychiatric: Normal mood and affect.  Laboratory Data: Lab Results  Component Value Date   WBC 4.2 03/18/2013   HGB 12.4 03/18/2013   HCT 36.6 03/18/2013   MCV 86 03/18/2013   PLT 230 03/18/2013    Lab Results  Component Value Date   CREATININE 0.81 03/18/2013    Urinalysis Results for orders placed or performed in visit on 11/20/15  Microscopic Examination  Result Value Ref Range   WBC, UA 6-10 (A) 0 - 5 /hpf   RBC, UA 0-2 0 - 2 /hpf   Epithelial Cells (non renal) 0-10 0 - 10 /hpf   Bacteria, UA Few None seen/Few  Urinalysis, Complete  Result Value Ref Range   Specific Gravity, UA 1.010 1.005 - 1.030   pH, UA 6.5 5.0 - 7.5   Color, UA Yellow Yellow   Appearance Ur Clear Clear   Leukocytes, UA 1+ (A) Negative   Protein, UA Negative Negative/Trace   Glucose, UA Negative Negative   Ketones, UA  Negative Negative   RBC, UA Trace (A) Negative   Bilirubin, UA Negative Negative   Urobilinogen, Ur 0.2 0.2 - 1.0 mg/dL   Nitrite, UA Negative Negative   Microscopic Examination See below:   BLADDER SCAN AMB NON-IMAGING  Result Value Ref Range   Scan Result 81     Pertinent Imaging: PVR 81 cc  Assessment & Plan:    1. Urinary frequency Treated for urinary tract infection 3 weeks ago with friendly positive UA although culture negative. UA was highly suspicious for infection, unclear why culture was negative. Symptoms improved after treatment but not fully resolved.  Urine culture repeated today  Suspect ongoing urinary frequency may be related to bladder irritation from recent infection. We'll treat for symptomatic relief with oxybutynin 5 mg 3 times a day when necessary. Patient will return within  the month if her symptoms fail to improve.  - Urinalysis, Complete - BLADDER SCAN AMB NON-IMAGING - CULTURE, URINE COMPREHENSIVE - GC/Chlamydia Probe Amp  2. Acute cystitis with hematuria recently treated for presumed urinary tract infection as above  3. 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 Routine screening for STI (sexually transmitted infection) Concern for possible STI exposure. We will send urine for chlamydia and gonorrhea.   Return if symptoms worsen or fail to improve.  Vanna Scotland, MD  [facility] [address] [place], [place] [address] [phone]

## 2015-11-22 LAB — GC/CHLAMYDIA PROBE AMP
CHLAMYDIA, DNA PROBE: NEGATIVE
Neisseria gonorrhoeae by PCR: NEGATIVE

## 2015-11-23 ENCOUNTER — Telehealth: Payer: Self-pay

## 2015-11-23 LAB — CULTURE, URINE COMPREHENSIVE

## 2015-11-23 NOTE — Telephone Encounter (Signed)
-----   Message from Vanna ScotlandAshley Brandon, MD sent at 11/23/2015  7:43 AM EDT ----- Please let this patient know her negative test results.    Vanna ScotlandAshley Brandon, MD

## 2015-11-23 NOTE — Telephone Encounter (Signed)
LMOM- most recent labs negative.  

## 2015-11-27 ENCOUNTER — Ambulatory Visit: Payer: Managed Care, Other (non HMO) | Admitting: Urology

## 2015-12-09 DIAGNOSIS — Z8619 Personal history of other infectious and parasitic diseases: Secondary | ICD-10-CM | POA: Insufficient documentation

## 2016-06-10 ENCOUNTER — Ambulatory Visit: Payer: Managed Care, Other (non HMO) | Admitting: Obstetrics and Gynecology

## 2016-06-30 ENCOUNTER — Encounter: Payer: Self-pay | Admitting: Obstetrics and Gynecology

## 2016-07-01 ENCOUNTER — Telehealth: Payer: Self-pay | Admitting: Obstetrics and Gynecology

## 2016-07-01 ENCOUNTER — Encounter: Payer: Self-pay | Admitting: Obstetrics and Gynecology

## 2016-07-01 ENCOUNTER — Ambulatory Visit (INDEPENDENT_AMBULATORY_CARE_PROVIDER_SITE_OTHER): Payer: Managed Care, Other (non HMO) | Admitting: Obstetrics and Gynecology

## 2016-07-01 VITALS — BP 122/88 | HR 66 | Ht 68.0 in | Wt 222.0 lb

## 2016-07-01 DIAGNOSIS — E559 Vitamin D deficiency, unspecified: Secondary | ICD-10-CM | POA: Diagnosis not present

## 2016-07-01 DIAGNOSIS — Z124 Encounter for screening for malignant neoplasm of cervix: Secondary | ICD-10-CM

## 2016-07-01 DIAGNOSIS — Z1231 Encounter for screening mammogram for malignant neoplasm of breast: Secondary | ICD-10-CM

## 2016-07-01 DIAGNOSIS — Z1239 Encounter for other screening for malignant neoplasm of breast: Secondary | ICD-10-CM

## 2016-07-01 DIAGNOSIS — Z113 Encounter for screening for infections with a predominantly sexual mode of transmission: Secondary | ICD-10-CM | POA: Diagnosis not present

## 2016-07-01 DIAGNOSIS — Z01419 Encounter for gynecological examination (general) (routine) without abnormal findings: Secondary | ICD-10-CM

## 2016-07-01 NOTE — Progress Notes (Signed)
Patient ID: Jacqueline [name], female   DOB: 1965-06-13, 51 y.o.   MRN: [id number]     Gynecology Annual Exam  PCP: Mick Sell, MD  Chief Complaint:  Chief Complaint  Patient presents with  . Gynecologic Exam    History of Present Illness:Patient is a 51 y.o. [id number] presents for annual exam. The patient has no complaints today.   LMP: No LMP recorded. Patient is postmenopausal. No postmenopausal bleeding  The patient is sexually active. She denies dyspareunia.  The patient does not perform self breast exams.  There is no notable family history of breast or ovarian cancer in her family.  The patient wears seatbelts: yes.   The patient has regular exercise: not asked.    The patient denies current symptoms of depression.     Review of Systems: Review of Systems  Constitutional: Negative for chills and fever.  HENT: Negative for congestion.   Respiratory: Negative for cough and shortness of breath.   Cardiovascular: Negative for chest pain and palpitations.  Gastrointestinal: Negative for abdominal pain, constipation, diarrhea, heartburn, nausea and vomiting.  Genitourinary: Negative for dysuria, frequency and urgency.  Skin: Negative for itching and rash.  Neurological: Negative for dizziness and headaches.  Endo/Heme/Allergies: Negative for polydipsia.  Psychiatric/Behavioral: Negative for depression.    Past Medical History:  Past Medical History:  Diagnosis Date  . Allergy   . Diabetes mellitus without complication (HCC)   . DVT (deep venous thrombosis) (HCC)    history of   . Hypertension   . Leaky heart valve   . Lupus   . Urethral stricture     Past Surgical History:  Past Surgical History:  Procedure Laterality Date  . CESAREAN SECTION      Gynecologic History:  No LMP recorded. Patient is postmenopausal. Last Pap: Results were: NIL and HR HPV negative 06/05/2015 Last mammogram: 06/05/2015 Results were: BI-RAD I Obstetric History:  [id number]  Family History:  Family History  Problem Relation Age of Onset  . Kidney disease Mother     only one kidney from accident at childhood  . Bladder Cancer Neg Hx     Social History:  Social History   Social History  . Marital status: Married    Spouse name: N/A  . Number of children: N/A  . Years of education: N/A   Occupational History  . Not on file.   Social History Main Topics  . Smoking status: Never Smoker  . Smokeless tobacco: Never Used  . Alcohol use No  . Drug use: No  . Sexual activity: Yes    Birth control/ protection: None   Other Topics Concern  . Not on file   Social History Narrative  . No narrative on file    Allergies:  Allergies  Allergen Reactions  . Ciprofloxacin Swelling  . Doxycycline Nausea Only and Other (See Comments)    GI Upset  . Gelatin     Alpha gal  . Milk-Related Compounds     Alpha gal  . Sulfa Antibiotics Hives    Medications: Prior to Admission medications   Medication Sig Start Date End Date Taking? Authorizing Provider  cetirizine (ZYRTEC) 10 MG tablet Take 10 mg by mouth daily.    Historical Provider, MD  diphenhydrAMINE (BENADRYL) 25 mg capsule Take 25 mg by mouth.    Historical Provider, MD  EPINEPHrine (EPIPEN 2-PAK) 0.3 mg/0.3 mL IJ SOAJ injection Inject 0.3 mg into the muscle. 01/07/15   Historical Provider, MD  Hydroxychloroquine Sulfate (  PLAQUENIL PO) Take by mouth.    Historical Provider, MD  metoprolol succinate (TOPROL-XL) 25 MG 24 hr tablet Take 25 mg by mouth daily.    Historical Provider, MD    Physical Exam Vitals: Blood pressure 122/88, pulse 66, height  (1.727 m), weight 222 lb (100.7 kg).  General: NAD HEENT: normocephalic, anicteric Thyroid: no enlargement, no palpable nodules Pulmonary: No increased work of breathing, CTAB Cardiovascular: RRR, distal pulses 2+ Breast: Breast symmetrical, no tenderness, no palpable nodules or masses, no skin or nipple retraction present, no nipple  discharge.  No axillary or supraclavicular lymphadenopathy. Abdomen: NABS, soft, non-tender, non-distended.  Umbilicus without lesions.  No hepatomegaly, splenomegaly or masses palpable. No evidence of hernia  Genitourinary:  External: Normal external female genitalia.  Normal urethral meatus, normal  Bartholin's and Skene's glands.    Vagina: Normal vaginal mucosa, no evidence of prolapse.    Cervix: Grossly normal in appearance, no bleeding  Uterus: Non-enlarged, mobile, normal contour.  No CMT  Adnexa: ovaries non-enlarged, no adnexal masses  Rectal: deferred  Lymphatic: no evidence of inguinal lymphadenopathy Extremities: no edema, erythema, or tenderness Neurologic: Grossly intact Psychiatric: mood appropriate, affect full  Female chaperone present for pelvic and breast  portions of the physical exam    Assessment: 51 y.o. [id number] routine annual exam  Plan: Problem List Items Addressed This Visit    None    Visit Diagnoses    Vitamin D deficiency    -  Primary   Relevant Orders   Vitamin D 25 hydroxy   Screening for malignant neoplasm of cervix       Relevant Orders   PapIG, CtNgTv, HPV, rfx 16/18   Breast screening       Relevant Orders   PapIG, CtNgTv, HPV, rfx 16/18   MM DIGITAL SCREENING BILATERAL   Encounter for gynecological examination without abnormal finding       Relevant Orders   PapIG, CtNgTv, HPV, rfx 16/18   Screen for STD (sexually transmitted disease)       Relevant Orders   PapIG, CtNgTv, HPV, rfx 16/18   Hepatitis C antibody   HEP, RPR, HIV Panel      1) Mammogram - recommend yearly screening mammogram.  Mammogram Was ordered today  2) STI screening was offered and accepted  3) ASCCP guidelines and rational discussed.  Patient opts for every 3 years screening interval  4) Osteoporosis  - per USPTF routine screening DEXA at age 25, does have history of chronic steroid use in past for lupus, no family history of hip fractures, vitamin D level  checked today  5) Routine healthcare maintenance including cholesterol, diabetes screening discussed managed by PCP  6) Colonoscopy - recommended declines given info on cologuard testing  7) Follow up 1 year for routine annual

## 2016-07-01 NOTE — Patient Instructions (Signed)

## 2016-07-01 NOTE — Telephone Encounter (Signed)
Pt aware order form is ready at front desk for her signature and then we can order test. KJ CMA

## 2016-07-01 NOTE — Telephone Encounter (Signed)
Pt stated at her OV today 07/01/16 Dr. Bonney Aid discussed pt doing Cologuard. Pt stated her insurance does cover that and would like to go ahead and move forward with the referral for Cologuard. Please advise. Thanks TNP

## 2016-07-02 LAB — HEP, RPR, HIV PANEL
HEP B S AG: NEGATIVE
HIV SCREEN 4TH GENERATION: NONREACTIVE
RPR Ser Ql: NONREACTIVE

## 2016-07-02 LAB — HEPATITIS C ANTIBODY

## 2016-07-02 LAB — VITAMIN D 25 HYDROXY (VIT D DEFICIENCY, FRACTURES): VIT D 25 HYDROXY: 34.7 ng/mL (ref 30.0–100.0)

## 2016-07-06 LAB — PAPIG, CTNGTV, HPV, RFX 16/18
CHLAMYDIA, NUC. ACID AMP: NEGATIVE
GONOCOCCUS, NUC. ACID AMP: NEGATIVE
HPV, HIGH-RISK: NEGATIVE
PAP Smear Comment: 0
TRICH VAG BY NAA: NEGATIVE

## 2016-07-22 ENCOUNTER — Ambulatory Visit
Admission: RE | Admit: 2016-07-22 | Discharge: 2016-07-22 | Disposition: A | Discharge status: Home / Self Care | Payer: Managed Care, Other (non HMO) | Source: Ambulatory Visit | Attending: Obstetrics and Gynecology | Admitting: Obstetrics and Gynecology

## 2016-07-22 DIAGNOSIS — R928 Other abnormal and inconclusive findings on diagnostic imaging of breast: Secondary | ICD-10-CM | POA: Insufficient documentation

## 2016-07-22 DIAGNOSIS — Z1231 Encounter for screening mammogram for malignant neoplasm of breast: Secondary | ICD-10-CM | POA: Diagnosis not present

## 2016-07-22 DIAGNOSIS — Z1239 Encounter for other screening for malignant neoplasm of breast: Secondary | ICD-10-CM

## 2016-07-25 ENCOUNTER — Other Ambulatory Visit: Payer: Self-pay | Admitting: Obstetrics and Gynecology

## 2016-07-25 DIAGNOSIS — R590 Localized enlarged lymph nodes: Secondary | ICD-10-CM

## 2016-07-25 DIAGNOSIS — R928 Other abnormal and inconclusive findings on diagnostic imaging of breast: Secondary | ICD-10-CM

## 2016-07-26 ENCOUNTER — Other Ambulatory Visit: Payer: Self-pay | Admitting: *Deleted

## 2016-07-26 ENCOUNTER — Inpatient Hospital Stay
Admission: RE | Admit: 2016-07-26 | Discharge: 2016-07-26 | Disposition: A | Discharge status: Home / Self Care | Payer: Self-pay | Source: Ambulatory Visit | Attending: *Deleted | Admitting: *Deleted

## 2016-07-26 DIAGNOSIS — Z9289 Personal history of other medical treatment: Secondary | ICD-10-CM

## 2016-08-03 ENCOUNTER — Encounter: Payer: Self-pay | Admitting: Obstetrics and Gynecology

## 2016-08-05 ENCOUNTER — Ambulatory Visit
Admission: RE | Admit: 2016-08-05 | Discharge: 2016-08-05 | Disposition: A | Discharge status: Home / Self Care | Payer: Managed Care, Other (non HMO) | Source: Ambulatory Visit | Attending: Obstetrics and Gynecology | Admitting: Obstetrics and Gynecology

## 2016-08-05 DIAGNOSIS — R928 Other abnormal and inconclusive findings on diagnostic imaging of breast: Secondary | ICD-10-CM | POA: Diagnosis present

## 2016-08-05 DIAGNOSIS — R590 Localized enlarged lymph nodes: Secondary | ICD-10-CM | POA: Insufficient documentation

## 2016-08-05 DIAGNOSIS — L732 Hidradenitis suppurativa: Secondary | ICD-10-CM | POA: Insufficient documentation

## 2016-08-05 DIAGNOSIS — M329 Systemic lupus erythematosus, unspecified: Secondary | ICD-10-CM | POA: Diagnosis not present

## 2016-08-09 ENCOUNTER — Other Ambulatory Visit: Payer: Self-pay | Admitting: Obstetrics and Gynecology

## 2016-08-09 DIAGNOSIS — Z9189 Other specified personal risk factors, not elsewhere classified: Secondary | ICD-10-CM

## 2016-08-09 DIAGNOSIS — R195 Other fecal abnormalities: Secondary | ICD-10-CM

## 2016-08-09 LAB — COLOGUARD

## 2016-08-09 NOTE — Progress Notes (Signed)
Positive cologuard testing specimen received 08/05/16 resulted 08/09/16 referral to GI

## 2016-09-14 ENCOUNTER — Ambulatory Visit (INDEPENDENT_AMBULATORY_CARE_PROVIDER_SITE_OTHER): Payer: Managed Care, Other (non HMO) | Admitting: Gastroenterology

## 2016-09-14 ENCOUNTER — Other Ambulatory Visit: Payer: Self-pay

## 2016-09-14 ENCOUNTER — Encounter: Payer: Self-pay | Admitting: Gastroenterology

## 2016-09-14 VITALS — BP 140/77 | HR 70 | Ht 68.0 in | Wt 222.0 lb

## 2016-09-14 DIAGNOSIS — R195 Other fecal abnormalities: Secondary | ICD-10-CM

## 2016-09-14 NOTE — Progress Notes (Addendum)
Gastroenterology Consultation  Referring Provider:     Mick SellFitzgerald, David P, MD Primary Care Physician:  Mick SellFitzgerald, David P, MD Primary Gastroenterologist:  Dr. Servando SnareWohl     Reason for Consultation:     Positive Cologurd        HPI:   Jacqueline [name] is a 51 y.o. y/o female referred for consultation & management of Positive Cologurd by Dr. Sampson GoonFitzgerald, Stann Mainlandavid P, MD.  This patient comes in today for a positive stool tests for screening colon cancer. The patient was seen by her OB/GYN and was offered a colonoscopy but the patient refused. The patient was then given information on Cologurd.   Past Medical History:  Diagnosis Date  . Allergy   . Diabetes mellitus without complication (HCC)   . DVT (deep venous thrombosis) (HCC)    history of   . Hypertension   . Leaky heart valve   . Lupus   . Urethral stricture     Past Surgical History:  Procedure Laterality Date  . CESAREAN SECTION      Prior to Admission medications   Medication Sig Start Date End Date Taking? Authorizing Provider  cetirizine (ZYRTEC) 10 MG tablet Take 10 mg by mouth daily.    [provider]  diphenhydrAMINE (BENADRYL) 25 mg capsule Take 25 mg by mouth.    [provider]  EPINEPHrine (EPIPEN 2-PAK) 0.3 mg/0.3 mL IJ SOAJ injection Inject 0.3 mg into the muscle. 01/07/15   [provider]  Hydroxychloroquine Sulfate (PLAQUENIL PO) Take by mouth.    [provider]  metoprolol succinate (TOPROL-XL) 25 MG 24 hr tablet Take 25 mg by mouth daily.    [provider]    Family History  Problem Relation Age of Onset  . Kidney disease Mother        only one kidney from accident at childhood  . Bladder Cancer Neg Hx   . Breast cancer Neg Hx      Social History  Substance Use Topics  . Smoking status: Never Smoker  . Smokeless tobacco: Never Used  . Alcohol use No    Allergies as of 09/14/2016 - Review Complete 07/01/2016  Allergen Reaction Noted  . Ciprofloxacin  Swelling 02/13/2015  . Doxycycline Nausea Only and Other (See Comments) 02/13/2015  . Gelatin  06/24/2014  . Milk-related compounds  06/24/2014  . Sulfa antibiotics Hives 07/12/2013    Review of Systems:    All systems reviewed and negative except where noted in HPI.   Physical Exam:  There were no vitals taken for this visit. No LMP recorded. Patient is postmenopausal. Psych:  Alert and cooperative. Normal mood and affect. General:   Alert,  Well-developed, well-nourished, pleasant and cooperative in NAD Head:  Normocephalic and atraumatic. Eyes:  Sclera clear, no icterus.   Conjunctiva pink. Ears:  Normal auditory acuity. Skin:  Intact without significant lesions or rashes.  No jaundice. Lymph Nodes:  No significant cervical adenopathy. Psych:  Alert and cooperative. Normal mood and affect.  Imaging Studies: No results found.  Assessment and Plan:   Jacqueline [name] is a 51 y.o. y/o female who comes in today after having a positive Cologuard test. The patient states she has some history of hemorrhoids. The patient denies any family history of colon cancer colon polyp. The patient will be set up for colonoscopy.I have discussed risks & benefits which include, but are not limited to, bleeding, infection, perforation & drug reaction.  The patient agrees with this plan &  written consent will be obtained.     Lucilla Lame, MD. Marval Regal   Note: This dictation was prepared with Dragon dictation along with smaller phrase technology. Any transcriptional errors that result from this process are unintentional.

## 2016-09-15 ENCOUNTER — Other Ambulatory Visit: Payer: Self-pay

## 2016-09-15 DIAGNOSIS — R195 Other fecal abnormalities: Secondary | ICD-10-CM

## 2016-10-06 ENCOUNTER — Encounter: Payer: Self-pay | Admitting: *Deleted

## 2016-10-10 ENCOUNTER — Ambulatory Visit: Payer: Managed Care, Other (non HMO) | Admitting: Anesthesiology

## 2016-10-13 NOTE — Discharge Instructions (Signed)
General Anesthesia, Adult, Care After °These instructions provide you with information about caring for yourself after your procedure. Your health care provider may also give you more specific instructions. Your treatment has been planned according to current medical practices, but problems sometimes occur. Call your health care provider if you have any problems or questions after your procedure. °What can I expect after the procedure? °After the procedure, it is common to have: °· Vomiting. °· A sore throat. °· Mental slowness. ° °It is common to feel: °· Nauseous. °· Cold or shivery. °· Sleepy. °· Tired. °· Sore or achy, even in parts of your body where you did not have surgery. ° °Follow these instructions at home: °For at least 24 hours after the procedure: °· Do not: °? Participate in activities where you could fall or become injured. °? Drive. °? Use heavy machinery. °? Drink alcohol. °? Take sleeping pills or medicines that cause drowsiness. °? Make important decisions or sign legal documents. °? Take care of children on your own. °· Rest. °Eating and drinking °· If you vomit, drink water, juice, or soup when you can drink without vomiting. °· Drink enough fluid to keep your urine clear or pale yellow. °· Make sure you have little or no nausea before eating solid foods. °· Follow the diet recommended by your health care provider. °General instructions °· Have a responsible adult stay with you until you are awake and alert. °· Return to your normal activities as told by your health care provider. Ask your health care provider what activities are safe for you. °· Take over-the-counter and prescription medicines only as told by your health care provider. °· If you smoke, do not smoke without supervision. °· Keep all follow-up visits as told by your health care provider. This is important. °Contact a health care provider if: °· You continue to have nausea or vomiting at home, and medicines are not helpful. °· You  cannot drink fluids or start eating again. °· You cannot urinate after 8-12 hours. °· You develop a skin rash. °· You have fever. °· You have increasing redness at the site of your procedure. °Get help right away if: °· You have difficulty breathing. °· You have chest pain. °· You have unexpected bleeding. °· You feel that you are having a life-threatening or urgent problem. °This information is not intended to replace advice given to you by your health care provider. Make sure you discuss any questions you have with your health care provider. °Document Released: 05/30/2000 Document Revised: 07/27/2015 Document Reviewed: 02/05/2015 °Elsevier Interactive Patient Education © 2018 [employer] ° °

## 2016-10-14 ENCOUNTER — Encounter
Admission: RE | Disposition: A | Discharge status: Home / Self Care | Payer: Self-pay | Source: Ambulatory Visit | Attending: Gastroenterology

## 2016-10-14 ENCOUNTER — Ambulatory Visit
Admission: RE | Admit: 2016-10-14 | Discharge: 2016-10-14 | Disposition: A | Discharge status: Home / Self Care | Payer: Managed Care, Other (non HMO) | Source: Ambulatory Visit | Attending: Gastroenterology | Admitting: Gastroenterology

## 2016-10-14 DIAGNOSIS — Z539 Procedure and treatment not carried out, unspecified reason: Secondary | ICD-10-CM | POA: Diagnosis not present

## 2016-10-14 DIAGNOSIS — Z1211 Encounter for screening for malignant neoplasm of colon: Secondary | ICD-10-CM | POA: Diagnosis present

## 2016-10-14 HISTORY — DX: Reserved for inherently not codable concepts without codable children: IMO0001

## 2016-10-14 HISTORY — DX: Prediabetes: R73.03

## 2016-10-14 HISTORY — DX: Motion sickness, initial encounter: T75.3XXA

## 2016-10-14 HISTORY — DX: Facial weakness: R29.810

## 2016-10-14 HISTORY — DX: Encounter for fitting and adjustment of orthodontic device: Z46.4

## 2016-10-14 HISTORY — DX: Sleep apnea, unspecified: G47.30

## 2016-10-14 HISTORY — DX: Gastro-esophageal reflux disease without esophagitis: K21.9

## 2016-10-14 HISTORY — DX: Presence of spectacles and contact lenses: Z97.3

## 2016-10-14 SURGERY — COLONOSCOPY WITH PROPOFOL
Anesthesia: General

## 2016-10-14 MED ORDER — LACTATED RINGERS IV SOLN: 10.0000 mL/h | INTRAVENOUS | Status: DC

## 2016-10-14 SURGICAL SUPPLY — 23 items
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
## 2016-10-14 NOTE — Progress Notes (Signed)
Patient has decided to reschedule. She states that at this point she would rather do procedure another time and bring someone else with her. MD Wohl aware.

## 2016-10-26 ENCOUNTER — Telehealth: Payer: Self-pay | Admitting: Gastroenterology

## 2016-10-26 NOTE — Telephone Encounter (Signed)
Patient needs to reschedule her colonoscopy with Dr. Allen Norris. She arrived with no ride so they canceled. She needs the paper work sent to her again. She also stated someone told her we would give her a prep kit. Please call

## 2016-10-28 ENCOUNTER — Other Ambulatory Visit: Payer: Self-pay

## 2016-10-28 DIAGNOSIS — R195 Other fecal abnormalities: Secondary | ICD-10-CM

## 2016-10-28 NOTE — Telephone Encounter (Signed)
Pt rescheduled colonoscopy to 11/04/16.

## 2016-10-31 ENCOUNTER — Other Ambulatory Visit: Payer: Self-pay

## 2016-10-31 ENCOUNTER — Ambulatory Visit: Payer: Managed Care, Other (non HMO) | Admitting: Podiatry

## 2016-10-31 ENCOUNTER — Encounter: Payer: Self-pay | Admitting: *Deleted

## 2016-11-03 NOTE — Discharge Instructions (Signed)
General Anesthesia, Adult, Care After °These instructions provide you with information about caring for yourself after your procedure. Your health care provider may also give you more specific instructions. Your treatment has been planned according to current medical practices, but problems sometimes occur. Call your health care provider if you have any problems or questions after your procedure. °What can I expect after the procedure? °After the procedure, it is common to have: °· Vomiting. °· A sore throat. °· Mental slowness. ° °It is common to feel: °· Nauseous. °· Cold or shivery. °· Sleepy. °· Tired. °· Sore or achy, even in parts of your body where you did not have surgery. ° °Follow these instructions at home: °For at least 24 hours after the procedure: °· Do not: °? Participate in activities where you could fall or become injured. °? Drive. °? Use heavy machinery. °? Drink alcohol. °? Take sleeping pills or medicines that cause drowsiness. °? Make important decisions or sign legal documents. °? Take care of children on your own. °· Rest. °Eating and drinking °· If you vomit, drink water, juice, or soup when you can drink without vomiting. °· Drink enough fluid to keep your urine clear or pale yellow. °· Make sure you have little or no nausea before eating solid foods. °· Follow the diet recommended by your health care provider. °General instructions °· Have a responsible adult stay with you until you are awake and alert. °· Return to your normal activities as told by your health care provider. Ask your health care provider what activities are safe for you. °· Take over-the-counter and prescription medicines only as told by your health care provider. °· If you smoke, do not smoke without supervision. °· Keep all follow-up visits as told by your health care provider. This is important. °Contact a health care provider if: °· You continue to have nausea or vomiting at home, and medicines are not helpful. °· You  cannot drink fluids or start eating again. °· You cannot urinate after 8-12 hours. °· You develop a skin rash. °· You have fever. °· You have increasing redness at the site of your procedure. °Get help right away if: °· You have difficulty breathing. °· You have chest pain. °· You have unexpected bleeding. °· You feel that you are having a life-threatening or urgent problem. °This information is not intended to replace advice given to you by your health care provider. Make sure you discuss any questions you have with your health care provider. °Document Released: 05/30/2000 Document Revised: 07/27/2015 Document Reviewed: 02/05/2015 °Elsevier Interactive Patient Education © 2018 [employer] ° °

## 2016-11-04 ENCOUNTER — Ambulatory Visit
Admission: RE | Admit: 2016-11-04 | Discharge: 2016-11-04 | Disposition: A | Discharge status: Home / Self Care | Payer: Managed Care, Other (non HMO) | Source: Ambulatory Visit | Attending: Gastroenterology | Admitting: Gastroenterology

## 2016-11-04 ENCOUNTER — Ambulatory Visit: Payer: Managed Care, Other (non HMO) | Admitting: Anesthesiology

## 2016-11-04 ENCOUNTER — Encounter
Admission: RE | Disposition: A | Discharge status: Home / Self Care | Payer: Self-pay | Source: Ambulatory Visit | Attending: Gastroenterology

## 2016-11-04 DIAGNOSIS — K529 Noninfective gastroenteritis and colitis, unspecified: Secondary | ICD-10-CM | POA: Insufficient documentation

## 2016-11-04 DIAGNOSIS — K641 Second degree hemorrhoids: Secondary | ICD-10-CM | POA: Diagnosis not present

## 2016-11-04 DIAGNOSIS — I10 Essential (primary) hypertension: Secondary | ICD-10-CM | POA: Insufficient documentation

## 2016-11-04 DIAGNOSIS — Z79899 Other long term (current) drug therapy: Secondary | ICD-10-CM | POA: Insufficient documentation

## 2016-11-04 DIAGNOSIS — K219 Gastro-esophageal reflux disease without esophagitis: Secondary | ICD-10-CM | POA: Insufficient documentation

## 2016-11-04 DIAGNOSIS — M329 Systemic lupus erythematosus, unspecified: Secondary | ICD-10-CM | POA: Insufficient documentation

## 2016-11-04 DIAGNOSIS — R195 Other fecal abnormalities: Secondary | ICD-10-CM

## 2016-11-04 DIAGNOSIS — G4730 Sleep apnea, unspecified: Secondary | ICD-10-CM | POA: Diagnosis not present

## 2016-11-04 DIAGNOSIS — R7303 Prediabetes: Secondary | ICD-10-CM | POA: Insufficient documentation

## 2016-11-04 HISTORY — PX: COLONOSCOPY WITH PROPOFOL: SHX5780

## 2016-11-04 SURGERY — COLONOSCOPY WITH PROPOFOL
Anesthesia: General | Wound class: Contaminated

## 2016-11-04 MED ORDER — ACETAMINOPHEN 325 MG PO TABS: 650.0000 mg | ORAL_TABLET | Freq: Once | ORAL | Status: DC | PRN

## 2016-11-04 MED ORDER — FENTANYL CITRATE (PF) 100 MCG/2ML IJ SOLN: 25.0000 ug | INTRAMUSCULAR | Status: DC | PRN

## 2016-11-04 MED ORDER — PROMETHAZINE HCL 25 MG/ML IJ SOLN: 6.2500 mg | INTRAMUSCULAR | Status: DC | PRN

## 2016-11-04 MED ORDER — MEPERIDINE HCL 25 MG/ML IJ SOLN: 6.2500 mg | INTRAMUSCULAR | Status: DC | PRN

## 2016-11-04 MED ORDER — ONDANSETRON HCL 4 MG/2ML IJ SOLN: 4.0000 mg | Freq: Once | INTRAMUSCULAR | Status: DC | PRN

## 2016-11-04 MED ORDER — ACETAMINOPHEN 160 MG/5ML PO SOLN: 325.0000 mg | ORAL | Status: DC | PRN

## 2016-11-04 MED ORDER — SODIUM CHLORIDE 0.9 % IV SOLN: INTRAVENOUS | Status: DC

## 2016-11-04 MED ORDER — SIMETHICONE 40 MG/0.6ML PO SUSP
ORAL | Status: DC | PRN
  Administered 2016-11-04: 10:00:00

## 2016-11-04 MED ORDER — OXYCODONE HCL 5 MG PO TABS: 5.0000 mg | ORAL_TABLET | Freq: Once | ORAL | Status: DC | PRN

## 2016-11-04 MED ORDER — LIDOCAINE HCL (CARDIAC) 20 MG/ML IV SOLN
INTRAVENOUS | Status: DC | PRN
  Administered 2016-11-04: 50 mg via INTRAVENOUS

## 2016-11-04 MED ORDER — LACTATED RINGERS IV SOLN
INTRAVENOUS | Status: DC
  Administered 2016-11-04: 08:00:00 via INTRAVENOUS

## 2016-11-04 MED ORDER — PROPOFOL 10 MG/ML IV BOLUS
INTRAVENOUS | Status: DC | PRN
  Administered 2016-11-04: 100 mg via INTRAVENOUS
  Administered 2016-11-04: 50 mg via INTRAVENOUS
  Administered 2016-11-04: 100 mg via INTRAVENOUS

## 2016-11-04 MED ORDER — OXYCODONE HCL 5 MG/5ML PO SOLN: 5.0000 mg | Freq: Once | ORAL | Status: DC | PRN

## 2016-11-04 SURGICAL SUPPLY — 23 items

## 2016-11-04 NOTE — H&P (Signed)
Midge Miniumarren Hamlet Lasecki, MD [facility] [address] [place], [place] [address] Phone:[phone] Fax : [phone]  Primary Care Physician:  Mick SellFitzgerald, David P, MD Primary Gastroenterologist:  Dr. Servando SnareWohl  Pre-Procedure History & Physical: HPI:  Jacqueline [name] is a 51 y.o. female is here for an colonoscopy.   Past Medical History:  Diagnosis Date  . Allergy   . DVT (deep venous thrombosis) (HCC)    history of - Right arm  . 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 GERD (gastroesophageal reflux disease)   . Hypertension   . Leaky heart valve   . Lupus   . Motion sickness    ocean ships  . Orthodontics    wears braces  . Pre-diabetes   . Sleep apnea    CPAP  . Urethral stricture   . Wears contact lenses     Past Surgical History:  Procedure Laterality Date  . CESAREAN SECTION    . CYST EXCISION     biopsy - neck  . VULVA /PERINEUM BIOPSY      Prior to Admission medications   Medication Sig Start Date End Date Taking? Authorizing Provider  cetirizine (ZYRTEC) 10 MG tablet Take 10 mg by mouth daily as needed.    Yes [provider]  Clobetasol Propionate Emulsion 0.05 % topical foam APPLY TO THE AFFECTED AREA ONCE TO TWICE DAILY AS NEEDED FOR ITCH. AVOID FACE, GROIN, AND AXILLA 10/07/16  Yes [provider]  diphenhydrAMINE (BENADRYL) 25 mg capsule Take 25 mg by mouth.   Yes [provider]  EPINEPHrine (EPIPEN 2-PAK) 0.3 mg/0.3 mL IJ SOAJ injection Inject 0.3 mg into the muscle. 01/07/15  Yes [provider]  Hydroxychloroquine Sulfate (PLAQUENIL PO) Take by mouth.   Yes [provider]  metoprolol succinate (TOPROL-XL) 25 MG 24 hr tablet Take 25 mg by mouth daily.   Yes [provider]    Allergies as of 10/28/2016 - Review Complete 10/14/2016  Allergen Reaction Noted  . Ciprofloxacin Swelling 02/13/2015  . Doxycycline Nausea Only and Other (See Comments) 02/13/2015  . Gelatin  06/24/2014  . Meat  extract  10/06/2016  . Milk-related compounds Hives and Swelling 06/24/2014  . Sulfa antibiotics Hives 07/12/2013    Family History  Problem Relation Age of Onset  . Kidney disease Mother        only one kidney from accident at childhood  . Bladder Cancer Neg Hx   . Breast cancer Neg Hx     Social History   Social History  . Marital status: Married    Spouse name: N/A  . Number of children: N/A  . Years of education: N/A   Occupational History  . Not on file.   Social History Main Topics  . Smoking status: Never Smoker  . Smokeless tobacco: Never Used  . Alcohol use No  . Drug use: No  . Sexual activity: Yes    Birth control/ protection: None   Other Topics Concern  . Not on file   Social History Narrative  . No narrative on file    Review of Systems: See HPI, otherwise negative ROS  Physical Exam: BP (!) 162/87   Pulse 77   Temp 99.5 F (37.5 C) (Temporal)   Ht 5\' 8"  (1.727 m)   Wt 211 lb (95.7 kg)   SpO2 100%   BMI 32.08 kg/m  General:   Alert,  pleasant and cooperative in NAD Head:  Normocephalic and  atraumatic. Neck:  Supple; no masses or thyromegaly. Lungs:  Clear throughout to auscultation.    Heart:  Regular rate and rhythm. Abdomen:  Soft, nontender and nondistended. Normal bowel sounds, without guarding, and without rebound.   Neurologic:  Alert and  oriented x4;  grossly normal neurologically.  Impression/Plan: Jacqueline [name] is here for an colonoscopy to be performed for positive cologurd  Risks, benefits, limitations, and alternatives regarding  colonoscopy have been reviewed with the patient.  Questions have been answered.  All parties agreeable.   Midge Minium, MD  11/04/2016, 8:59 AM

## 2016-11-04 NOTE — Op Note (Signed)
[facility] Gastroenterology Patient Name: Jacqueline [name] Procedure Date: 11/04/2016 9:27 AM MRN: [id number] Account #: [id number] Date of Birth: 19-Nov-1965 Admit Type: Outpatient Age: 51 Room: [facility] OR ROOM 01 Gender: Female Note Status: Finalized Procedure:            Colonoscopy Indications:          Positive Cologuard test Providers:            Midge Minium MD, MD Referring MD:         Clydie Braun (Referring MD) Medicines:            Propofol per Anesthesia Complications:        No immediate complications. Procedure:            Pre-Anesthesia Assessment:                       - Prior to the procedure, a History and Physical was                        performed, and patient medications and allergies were                        reviewed. The patient's tolerance of previous                        anesthesia was also reviewed. The risks and benefits of                        the procedure and the sedation options and risks were                        discussed with the patient. All questions were                        answered, and informed consent was obtained. Prior                        Anticoagulants: The patient has taken no previous                        anticoagulant or antiplatelet agents. ASA Grade                        Assessment: II - A patient with mild systemic disease.                        After reviewing the risks and benefits, the patient was                        deemed in satisfactory condition to undergo the                        procedure.                       After obtaining informed consent, the colonoscope was                        passed under direct vision. Throughout the procedure,  the patient's blood pressure, pulse, and oxygen                        saturations were monitored continuously. The Olympus                        CF-HQ190L Colonoscope (S#. [id number]) was introduced   through the anus and advanced to the the cecum,                        identified by appendiceal orifice and ileocecal valve.                        The colonoscopy was performed without difficulty. The                        patient tolerated the procedure well. The quality of                        the bowel preparation was excellent. Findings:      The perianal and digital rectal examinations were normal.      Localized mild inflammation characterized by erythema was found in the       sigmoid colon. Biopsies were taken with a cold forceps for histology.       Biopsies were taken with a cold forceps for histology.      Non-bleeding internal hemorrhoids were found during retroflexion. The       hemorrhoids were Grade II (internal hemorrhoids that prolapse but reduce       spontaneously). Impression:           - Localized mild inflammation was found in the sigmoid                        colon secondary to colitis. Biopsied.                       - Non-bleeding internal hemorrhoids. Recommendation:       - Discharge patient to home.                       - Resume previous diet.                       - Continue present medications.                       - Await pathology results. Procedure Code(s):    --- Professional ---                       [phone], Colonoscopy, flexible; with biopsy, single or                        multiple Diagnosis Code(s):    --- Professional ---                       R19.5, Other fecal abnormalities                       K52.9, Noninfective gastroenteritis and colitis,  unspecified CPT copyright 2016 American Medical Association. All rights reserved. The codes documented in this report are preliminary and upon coder review may  be revised to meet current compliance requirements. Midge Minium MD, MD 11/04/2016 9:58:32 AM This report has been signed electronically. Number of Addenda: 0 Note Initiated On: 11/04/2016 9:27 AM Scope Withdrawal  Time: 0 hours 6 minutes 30 seconds  Total Procedure Duration: 0 hours 11 minutes 36 seconds       [facility]

## 2016-11-04 NOTE — Anesthesia Procedure Notes (Addendum)
Performed by: Carmeron Heady Pre-anesthesia Checklist: Patient identified, Emergency Drugs available, Suction available, Timeout performed and Patient being monitored Patient Re-evaluated:Patient Re-evaluated prior to induction Oxygen Delivery Method: Nasal cannula Placement Confirmation: positive ETCO2       

## 2016-11-04 NOTE — Anesthesia Postprocedure Evaluation (Signed)
Anesthesia Post Note  Patient: Jacqueline [name]  Procedure(s) Performed: Procedure(s) (LRB): COLONOSCOPY WITH PROPOFOL (N/A)  Patient location during evaluation: PACU Anesthesia Type: General Level of consciousness: awake and alert, oriented and patient cooperative Pain management: pain level controlled Vital Signs Assessment: post-procedure vital signs reviewed and stable Respiratory status: spontaneous breathing, nonlabored ventilation and respiratory function stable Cardiovascular status: blood pressure returned to baseline and stable Postop Assessment: adequate PO intake Anesthetic complications: no    Reed BreechAndrea Syniyah Bourne

## 2016-11-04 NOTE — Anesthesia Preprocedure Evaluation (Signed)
Anesthesia Evaluation  Patient identified by MRN, date of birth, ID band Patient awake    Reviewed: Allergy & Precautions, NPO status , Patient's Chart, lab work & pertinent test results  History of Anesthesia Complications Negative for: history of anesthetic complications  Airway Mallampati: I  TM Distance: >3 FB Neck ROM: Full    Dental  (+) Teeth Intact Braces :   Pulmonary sleep apnea and Continuous Positive Airway Pressure Ventilation ,    Pulmonary exam normal breath sounds clear to auscultation       Cardiovascular Exercise Tolerance: Good hypertension, + Valvular Problems/Murmurs (heart murmur)  Rhythm:Regular Rate:Normal + Systolic murmurs Hx RUE DVT; no longer on anticoaguation   Neuro/Psych negative neurological ROS     GI/Hepatic GERD  ,  Endo/Other  diabetes (diet-controlled)  Renal/GU negative Renal ROS     Musculoskeletal   Abdominal   Peds  Hematology SLE   Anesthesia Other Findings   Reproductive/Obstetrics                             Anesthesia Physical Anesthesia Plan  ASA: II  Anesthesia Plan: General   Post-op Pain Management:    Induction: Intravenous  PONV Risk Score and Plan: 2 and Ondansetron and Propofol infusion  Airway Management Planned: Natural Airway  Additional Equipment:   Intra-op Plan:   Post-operative Plan:   Informed Consent: I have reviewed the patients History and Physical, chart, labs and discussed the procedure including the risks, benefits and alternatives for the proposed anesthesia with the patient or authorized representative who has indicated his/her understanding and acceptance.     Plan Discussed with: CRNA  Anesthesia Plan Comments:         Anesthesia Quick Evaluation

## 2016-11-04 NOTE — Transfer of Care (Signed)
Immediate Anesthesia Transfer of Care Note  Patient: Jacqueline [name]  Procedure(s) Performed: Procedure(s): COLONOSCOPY WITH PROPOFOL (N/A)  Patient Location: PACU  Anesthesia Type: General  Level of Consciousness: awake, alert  and patient cooperative  Airway and Oxygen Therapy: Patient Spontanous Breathing and Patient connected to supplemental oxygen  Post-op Assessment: Post-op Vital signs reviewed, Patient's Cardiovascular Status Stable, Respiratory Function Stable, Patent Airway and No signs of Nausea or vomiting  Post-op Vital Signs: Reviewed and stable  Complications: No apparent anesthesia complications

## 2016-11-10 LAB — SURGICAL PATHOLOGY

## 2016-11-14 ENCOUNTER — Encounter: Payer: Self-pay | Admitting: Gastroenterology

## 2016-11-18 ENCOUNTER — Encounter: Payer: Self-pay | Admitting: Podiatry

## 2016-11-18 ENCOUNTER — Ambulatory Visit (INDEPENDENT_AMBULATORY_CARE_PROVIDER_SITE_OTHER): Payer: Managed Care, Other (non HMO) | Admitting: Podiatry

## 2016-11-18 DIAGNOSIS — B353 Tinea pedis: Secondary | ICD-10-CM | POA: Diagnosis not present

## 2016-11-18 MED ORDER — CLOTRIMAZOLE-BETAMETHASONE 1-0.05 % EX CREA: 1.0000 "application " | TOPICAL_CREAM | Freq: Two times a day (BID) | CUTANEOUS | 1 refills | Status: DC

## 2016-11-18 MED ORDER — TERBINAFINE HCL 250 MG PO TABS: 250.0000 mg | ORAL_TABLET | Freq: Every day | ORAL | 0 refills | Status: DC

## 2016-11-18 NOTE — Patient Instructions (Signed)
Lamisil  pill x 4 weeks. Lotrisone antifungal cream x 4 weeks.   AmLactin lotion daily after.

## 2016-11-22 NOTE — Progress Notes (Signed)
   HPI: Patient is a 51 year old female presenting today with a new complaint of cracking skin to the heels and 5th toes bilaterally that began several weeks ago. She denies any trauma or injury. She has not done anything to treat her symptoms. She is here for further evaluation and treatment.   Past Medical History:  Diagnosis Date  . Allergy   . DVT (deep venous thrombosis) (HCC)    history of - Right arm  . 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 GERD (gastroesophageal reflux disease)   . Hypertension   . Leaky heart valve   . Lupus   . Motion sickness    ocean ships  . Orthodontics    wears braces  . Pre-diabetes   . Sleep apnea    CPAP  . Urethral stricture   . Wears contact lenses      Physical Exam: General: The patient is alert and oriented x3 in no acute distress.  Dermatology: Pruritus to the bilateral feet with hyperkeratosis. Skin is warm, dry and supple bilateral lower extremities. Negative for open lesions or macerations.  Vascular: Palpable pedal pulses bilaterally. No edema or erythema noted. Capillary refill within normal limits.  Neurological: Epicritic and protective threshold grossly intact bilaterally.   Musculoskeletal Exam: Range of motion within normal limits to all pedal and ankle joints bilateral. Muscle strength 5/5 in all groups bilateral.     Assessment: - Tinea Pedis bilaterally   Plan of Care:  - Patient evaluated. - Prescription for Lamisil 250 mg # 28 given to patient. - Prescription for Lotrisone Cream given to patient.  - Recommended AmLactin lotion daily. - Return to clinic when necessary.   Felecia Shelling, DPM [facility]  Dr. Felecia Shelling, DPM    [address] [place], [place] [address]                Office [phone]  Fax [phone]

## 2017-06-30 ENCOUNTER — Other Ambulatory Visit: Payer: Self-pay | Admitting: Obstetrics and Gynecology

## 2017-06-30 DIAGNOSIS — Z1231 Encounter for screening mammogram for malignant neoplasm of breast: Secondary | ICD-10-CM

## 2017-07-14 ENCOUNTER — Encounter: Payer: Self-pay | Admitting: Obstetrics and Gynecology

## 2017-07-14 ENCOUNTER — Ambulatory Visit (INDEPENDENT_AMBULATORY_CARE_PROVIDER_SITE_OTHER): Payer: Managed Care, Other (non HMO) | Admitting: Obstetrics and Gynecology

## 2017-07-14 DIAGNOSIS — Z01419 Encounter for gynecological examination (general) (routine) without abnormal findings: Secondary | ICD-10-CM | POA: Diagnosis not present

## 2017-07-14 DIAGNOSIS — Z1231 Encounter for screening mammogram for malignant neoplasm of breast: Secondary | ICD-10-CM | POA: Diagnosis not present

## 2017-07-14 DIAGNOSIS — Z1239 Encounter for other screening for malignant neoplasm of breast: Secondary | ICD-10-CM

## 2017-07-14 NOTE — Progress Notes (Signed)
Patient ID: Jacqueline [name], female   DOB: 05-09-1965, 52 y.o.   MRN: [id number]      Gynecology Annual Exam  PCP: Mick Sell, MD  Chief Complaint:  Chief Complaint  Patient presents with  . Gynecologic Exam    History of Present Illness:Patient is a 52 y.o. [id number] presents for annual exam. The patient has no complaints today.   LMP: No LMP recorded. Patient is postmenopausal. No PMB   The patient is sexually active. She denies dyspareunia.  The patient does perform self breast exams.  There is no notable family history of breast or ovarian cancer in her family.  The patient wears seatbelts: yes.   The patient has regular exercise: not asked.    The patient denies current symptoms of depression.     Review of Systems: Review of Systems  Constitutional: Negative for chills and fever.  HENT: Negative for congestion.   Respiratory: Negative for cough and shortness of breath.   Cardiovascular: Negative for chest pain and palpitations.  Gastrointestinal: Negative for abdominal pain, constipation, diarrhea, heartburn, nausea and vomiting.  Genitourinary: Negative for dysuria, frequency and urgency.  Skin: Negative for itching and rash.  Neurological: Negative for dizziness and headaches.  Endo/Heme/Allergies: Negative for polydipsia.  Psychiatric/Behavioral: Negative for depression.    Past Medical History:  Past Medical History:  Diagnosis Date  . Allergy   . DVT (deep venous thrombosis) (HCC)    history of - Right arm  . 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 GERD (gastroesophageal reflux disease)   . Hypertension   . Leaky heart valve   . Lupus (HCC)   . Motion sickness    ocean ships  . Orthodontics    wears braces  . Pre-diabetes   . Sleep apnea    CPAP  . Urethral stricture   . Wears contact lenses     Past Surgical History:  Past Surgical History:  Procedure Laterality Date  . CESAREAN SECTION    . COLONOSCOPY WITH PROPOFOL  N/A 11/04/2016   Procedure: COLONOSCOPY WITH PROPOFOL;  Surgeon: Midge Minium, MD;  Location: [facility];  Service: Gastroenterology;  Laterality: N/A;  . CYST EXCISION     biopsy - neck  . VULVA /PERINEUM BIOPSY      Gynecologic History:  No LMP recorded. Patient is postmenopausal. Last Pap: Results were: 06/05/2015 NIL HPV negative  Last mammogram: 08/05/16 Results were: Elby Showers I  Obstetric History: [id number]  Family History:  Family History  Problem Relation Age of Onset  . Kidney disease Mother        only one kidney from accident at childhood  . Bladder Cancer Neg Hx   . Breast cancer Neg Hx     Social History:  Social History   Socioeconomic History  . Marital status: Married    Spouse name: Not on file  . Number of children: Not on file  . Years of education: Not on file  . Highest education level: Not on file  Occupational History  . Not on file  Social Needs  . Financial resource strain: Not on file  . Food insecurity:    Worry: Not on file    Inability: Not on file  . Transportation needs:    Medical: Not on file    Non-medical: Not on file  Tobacco Use  . Smoking status: Never Smoker  . Smokeless tobacco: Never Used  Substance and Sexual Activity  . Alcohol use:  No  . Drug use: No  . Sexual activity: Yes    Birth control/protection: None  Lifestyle  . Physical activity:    Days per week: 0 days    Minutes per session: 0 min  . Stress: Only a little  Relationships  . Social connections:    Talks on phone: Not on file    Gets together: Not on file    Attends religious service: Not on file    Active member of club or organization: Not on file    Attends meetings of clubs or organizations: Not on file    Relationship status: Not on file  . Intimate partner violence:    Fear of current or ex partner: Not on file    Emotionally abused: Not on file    Physically abused: Not on file    Forced sexual activity: Not on file  Other Topics Concern    . Not on file  Social History Narrative  . Not on file    Allergies:  Allergies  Allergen Reactions  . Ciprofloxacin Swelling  . Doxycycline Nausea Only and Other (See Comments)    GI Upset  . Gelatin     Alpha gal  . Meat Extract     Alpha-gal - all mammal products  . Milk-Related Compounds Hives and Swelling    Alpha gal  . Sulfa Antibiotics Hives    Medications: Prior to Admission medications   Medication Sig Start Date End Date Taking? Authorizing Provider  Clobetasol Propionate Emulsion 0.05 % topical foam APPLY TO THE AFFECTED AREA ONCE TO TWICE DAILY AS NEEDED FOR ITCH. AVOID FACE, GROIN, AND AXILLA 10/07/16  Yes [provider]  diphenhydrAMINE (BENADRYL) 25 mg capsule Take 25 mg by mouth.   Yes [provider]  Hydroxychloroquine Sulfate (PLAQUENIL PO) Take by mouth.   Yes [provider]  metoprolol succinate (TOPROL-XL) 25 MG 24 hr tablet Take 25 mg by mouth daily.   Yes [provider]  terbinafine (LAMISIL) 250 MG tablet Take 1 tablet (250 mg total) by mouth daily. 11/18/16  Yes Felecia Shelling, DPM  clotrimazole-betamethasone (LOTRISONE) cream Apply 1 application topically 2 (two) times daily. Patient not taking: Reported on 07/14/2017 11/18/16   Felecia Shelling, DPM    Physical Exam Vitals: Blood pressure 120/82, pulse 72, height  (1.727 m), weight 226 lb (102.5 kg).  General: NAD HEENT: normocephalic, anicteric Thyroid: no enlargement, no palpable nodules Pulmonary: No increased work of breathing, CTAB Cardiovascular: RRR, distal pulses 2+ Breast: Breast symmetrical, no tenderness, no palpable nodules or masses, no skin or nipple retraction present, no nipple discharge.  No axillary or supraclavicular lymphadenopathy. Abdomen: NABS, soft, non-tender, non-distended.  Umbilicus without lesions.  No hepatomegaly, splenomegaly or masses palpable. No evidence of hernia  Genitourinary:  External: Normal external female  genitalia.  Normal urethral meatus, normal Bartholin's and Skene's glands.    Vagina: Normal vaginal mucosa, no evidence of prolapse.    Cervix: Grossly normal in appearance, no bleeding  Uterus: Non-enlarged, mobile, normal contour.  No CMT  Adnexa: ovaries non-enlarged, no adnexal masses  Rectal: deferred  Lymphatic: no evidence of inguinal lymphadenopathy Extremities: no edema, erythema, or tenderness Neurologic: Grossly intact Psychiatric: mood appropriate, affect full  Female chaperone present for pelvic and breast  portions of the physical exam     Assessment: 52 y.o. [id number] routine annual exam  Plan: Problem List Items Addressed This Visit    None    Visit Diagnoses  Breast screening       Encounter for gynecological examination without abnormal finding          1) Mammogram - recommend yearly screening mammogram.  Mammogram Is up to date  2) STI screening  was notoffered and therefore not obtained  3) ASCCP guidelines and rational discussed.  Patient opts for every 3 years screening interval  4) Osteoporosis  - per USPTF routine screening DEXA at age 5   5) Routine healthcare maintenance including cholesterol, diabetes screening discussed managed by PCP  6) Colonoscopy 11/04/2016 Colonoscopy normal following positive cologuard test  7) Return in about 1 year (around 07/15/2018) for annual.     Vena Austria, MD Domingo Pulse, [facility] 07/14/2017, 3:35 PM

## 2017-07-24 ENCOUNTER — Ambulatory Visit
Admission: RE | Admit: 2017-07-24 | Discharge: 2017-07-24 | Disposition: A | Discharge status: Home / Self Care | Payer: Managed Care, Other (non HMO) | Source: Ambulatory Visit | Attending: Obstetrics and Gynecology | Admitting: Obstetrics and Gynecology

## 2017-07-24 DIAGNOSIS — Z1231 Encounter for screening mammogram for malignant neoplasm of breast: Secondary | ICD-10-CM | POA: Diagnosis present

## 2017-09-20 DIAGNOSIS — I10 Essential (primary) hypertension: Secondary | ICD-10-CM | POA: Insufficient documentation

## 2017-12-16 ENCOUNTER — Encounter (INDEPENDENT_AMBULATORY_CARE_PROVIDER_SITE_OTHER): Payer: Self-pay

## 2017-12-16 ENCOUNTER — Ambulatory Visit (INDEPENDENT_AMBULATORY_CARE_PROVIDER_SITE_OTHER): Payer: BC Managed Care – PPO

## 2017-12-16 DIAGNOSIS — Z6825 Body mass index (BMI) 25.0-25.9, adult: Secondary | ICD-10-CM

## 2017-12-16 DIAGNOSIS — H5789 Other specified disorders of eye and adnexa: Secondary | ICD-10-CM | POA: Insufficient documentation

## 2017-12-16 MED ORDER — DEXAMETHASONE SODIUM PHOSPHATE 4 MG/ML INJECTION SOLUTION
4.00 mg | Freq: Once | INTRAMUSCULAR | 0 refills | Status: AC
Start: 2017-12-16 — End: 2017-12-16

## 2017-12-16 MED ORDER — PREDNISONE 10 MG TABLET
ORAL_TABLET | ORAL | 0 refills | Status: AC
Start: 2017-12-16 — End: ?

## 2017-12-16 NOTE — Progress Notes (Signed)
Patient Name: Alice Soto  Date: 12/16/2017  [facility] CARE  URGENT CARE CLINIC  [address]  [place] [address]  [id number]  MRN: [id number]  DOB: Jan 18, 1966    Chief complaint:   Eye Swelling (Bilateral eyes swelling, allergic reaction, was treated with prednisone (started on 12/04/15) finished about a week ago, sx started to return on last day of prednisone).    HPI:  The patient is a 52 y.o. old female who came in today for as stated above.  Patient does not have any breathing issues or tongue swelling.  Patient is here today for evaluation.    ROS:  Review of Systems   Constitutional: Negative.    HENT: Negative.    Eyes: Negative.    Respiratory: Negative.    Cardiovascular: Negative.    Gastrointestinal: Negative.    Musculoskeletal: Negative.    Skin: Negative.    Neurological: Negative.    Psychiatric/Behavioral: Negative.    All other systems reviewed and are negative.          Past medical history:  History reviewed. No pertinent past medical history.  Past Medical History was reviewed and is negative for relevance    Past surgical history:  Past Surgical History:   Procedure Laterality Date   . HX TONSILLECTOMY     . HX WISDOM TEETH EXTRACTION           Medications:  Current Outpatient Medications   Medication Sig   . dexamethasone (DECADRON) 4 mg/mL Injection Solution 1 mL (4 mg total) by Intramuscular route One time for 1 dose   . predniSONE (DELTASONE) 10 mg Oral Tablet 3 tabs PO for 3 days, then 2 tabs PO for 3 days, then 1 tab PO for 3 days.     Allergies:  No Known Allergies  Family history:  Family Medical History:     Problem Relation (Age of Onset)    Alzheimer's/Dementia Father    Diabetes Mother    High Cholesterol Mother, Father    Hypertension (High Blood Pressure) Mother, Father    Thyroid Disease Sister            Social history:  Social History     Socioeconomic History   . Marital status: Married     Spouse name: Not on file   . Number of children: Not on file   .  Years of education: Not on file   . Highest education level: Not on file   Tobacco Use   . Smoking status: Never Smoker   . Smokeless tobacco: Never Used   Substance and Sexual Activity   . Alcohol use: Not Currently       Vitals:    12/16/17 1352   BP: 134/86   Pulse: 80   Resp: 18   Temp: 37.2 C (98.9 F)   SpO2: 99%   Weight: 68.9 kg (151 lb 12.8 oz)   Height: 1.638 m (5' 4.5")   BMI: 25.71         Body mass index is 25.65 kg/m.    Physical Examination:  Physical Exam   Constitutional: She is oriented to person, place, and time and well-developed, well-nourished, and in no distress.   HENT:   Head: Normocephalic and atraumatic.   Eyes: Pupils are equal, round, and reactive to light. Conjunctivae and EOM are normal.   Bilateral orbital swelling noted - mildly.     Neck: Normal range of motion. Neck supple.   Cardiovascular: Normal rate,  regular rhythm and normal heart sounds.   Pulmonary/Chest: Effort normal and breath sounds normal.   Abdominal: Soft. Bowel sounds are normal.   Musculoskeletal: Normal range of motion.   Neurological: She is alert and oriented to person, place, and time. Gait normal.   Skin: Skin is warm and dry.   Psychiatric: Affect normal.   Nursing note and vitals reviewed.         Visit Diagnosis and plan    ICD-10-CM    1. Eye swelling, bilateral H57.89      Orders Placed This Encounter   . predniSONE (DELTASONE) 10 mg Oral Tablet   . dexamethasone (DECADRON) 4 mg/mL Injection Solution     F/u with PCP as scheduled.   Look for irritants that could be causing allergic reaction.  May need allergy testing.    No follow-ups on file.     Patient was seen independently.      Marlana Salvage, PA  12/16/2017,   14:02  Electronically signed by Marlana Salvage, PA      I was personally available for consultation during this visit.  I have reviewed the chart and agree with the documentation as recorded by the APP, including the treatment plan and disposition.  Patient encouraged to follow up with PCP; recheck  here as needed.    Hosie Spangle, MD  12/16/2017, 15:16  Electronically signed by Hosie Spangle, MD

## 2017-12-16 NOTE — Nursing Note (Signed)
12/16/17 1400   Nursing Procedures / Med Admin   Medications A-D Dexamethasone sodium phosphate   Medication Dose 4mg    Route of Administration IM   Site Right Gluteus   NDC # [id number]   LOT # [id number]   Expiration date 06/04/19   Manufacturer mylan   Clinic Supplied Yes   Initials LDS

## 2018-07-23 ENCOUNTER — Ambulatory Visit: Payer: Managed Care, Other (non HMO) | Admitting: Obstetrics and Gynecology

## 2018-08-17 ENCOUNTER — Ambulatory Visit: Payer: Managed Care, Other (non HMO) | Admitting: Obstetrics and Gynecology

## 2018-08-23 NOTE — Patient Instructions (Signed)
[facility] [address] [place] [address]  [facility] [place]  [address] [place] [address]  Phone: [phone]  

## 2018-08-23 NOTE — Progress Notes (Signed)
Gynecology Annual Exam  PCP: Leonel Ramsay, MD  Chief Complaint:  Chief Complaint  Patient presents with  . Gynecologic Exam    frequent urination at night    History of Present Illness:Patient is a 53 y.o. [id number] presents for annual exam. The patient has no complaints today.   LMP: No LMP recorded. Patient is postmenopausal.  The patient is sexually active. She denies dyspareunia.  The patient does perform self breast exams.  There is no notable family history of breast or ovarian cancer in her family.  The patient wears seatbelts: yes.   The patient has regular exercise: not asked.    The patient reports current symptoms of depression.   A lot of her symptoms are situational and related to her husbands recent diagnosis of lung cancer in February.  Underwent wedge resection but was then placed on chemotherapy recently.  Review of Systems: Review of Systems  Constitutional: Negative for chills and fever.  HENT: Negative for congestion.   Respiratory: Negative for cough and shortness of breath.   Cardiovascular: Negative for chest pain and palpitations.  Gastrointestinal: Negative for abdominal pain, constipation, diarrhea, heartburn, nausea and vomiting.  Genitourinary: Negative for dysuria, frequency and urgency.  Skin: Negative for itching and rash.  Neurological: Negative for dizziness and headaches.  Endo/Heme/Allergies: Negative for polydipsia.  Psychiatric/Behavioral: Negative for depression.    Past Medical History:  Past Medical History:  Diagnosis Date  . Allergy   . DVT (deep venous thrombosis) (HCC)    history of - Right arm  . 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 GERD (gastroesophageal reflux disease)   . Hypertension   . Leaky heart valve   . Lupus ([place])   . Motion sickness    ocean ships  . Orthodontics    wears braces  . Pre-diabetes   . Sleep apnea    CPAP  . Urethral stricture   . Wears contact lenses      Past Surgical History:  Past Surgical History:  Procedure Laterality Date  . CESAREAN SECTION    . COLONOSCOPY WITH PROPOFOL N/A 11/04/2016   Procedure: COLONOSCOPY WITH PROPOFOL;  Surgeon: Lucilla Lame, MD;  Location: [place];  Service: Gastroenterology;  Laterality: N/A;  . CYST EXCISION     biopsy - neck  . VULVA /PERINEUM BIOPSY      Gynecologic History:  No LMP recorded. Patient is postmenopausal. Last Pap: Results were: 07/01/2016 NIL and HR HPV negative  Last mammogram: 07/24/2017 Results were: BI-RAD I  Obstetric History: [id number]  Family History:  Family History  Problem Relation Age of Onset  . Kidney disease Mother        only one kidney from accident at childhood  . Lung cancer Father 7  . Bladder Cancer Neg Hx   . Breast cancer Neg Hx     Social History:  Social History   Socioeconomic History  . Marital status: Married    Spouse name: Not on file  . Number of children: Not on file  . Years of education: Not on file  . Highest education level: Not on file  Occupational History  . Not on file  Social Needs  . Financial resource strain: Not on file  . Food insecurity    Worry: Not on file    Inability: Not on file  . Transportation needs    Medical: Not on file    Non-medical: Not on file  Tobacco Use  . Smoking status: Never Smoker  . Smokeless tobacco: Never Used  Substance and Sexual Activity  . Alcohol use: No  . Drug use: No  . Sexual activity: Yes    Birth control/protection: None  Lifestyle  . Physical activity    Days per week: 0 days    Minutes per session: 0 min  . Stress: Only a little  Relationships  . Social Musicianconnections    Talks on phone: Not on file    Gets together: Not on file    Attends religious service: Not on file    Active member of club or organization: Not on file    Attends meetings of clubs or organizations: Not on file    Relationship status: Not on file  . Intimate partner violence    Fear of current  or ex partner: Not on file    Emotionally abused: Not on file    Physically abused: Not on file    Forced sexual activity: Not on file  Other Topics Concern  . Not on file  Social History Narrative  . Not on file    Allergies:  Allergies  Allergen Reactions  . Ciprofloxacin Swelling  . Doxycycline Nausea Only and Other (See Comments)    GI Upset  . Gelatin     Alpha gal  . Meat Extract     Alpha-gal - all mammal products  . Milk-Related Compounds Hives and Swelling    Alpha gal  . Sulfa Antibiotics Hives    Medications: Prior to Admission medications   Medication Sig Start Date End Date Taking? Authorizing Provider  Clobetasol Propionate Emulsion 0.05 % topical foam APPLY TO THE AFFECTED AREA ONCE TO TWICE DAILY AS NEEDED FOR ITCH. AVOID FACE, GROIN, AND AXILLA 10/07/16   [provider]  clotrimazole-betamethasone (LOTRISONE) cream Apply 1 application topically 2 (two) times daily. Patient not taking: Reported on 07/14/2017 11/18/16   Felecia ShellingEvans, Brent M, DPM  diphenhydrAMINE (BENADRYL) 25 mg capsule Take 25 mg by mouth.    [provider]  Hydroxychloroquine Sulfate (PLAQUENIL PO) Take by mouth.    [provider]  metoprolol succinate (TOPROL-XL) 25 MG 24 hr tablet Take 25 mg by mouth daily.    [provider]  terbinafine (LAMISIL) 250 MG tablet Take 1 tablet (250 mg total) by mouth daily. 11/18/16   Felecia ShellingEvans, Brent M, DPM    Physical Exam Vitals: Blood pressure 112/84, pulse 66, height 5\' 7"  (1.702 m), weight 218 lb (98.9 kg).   General: NAD HEENT: normocephalic, anicteric Thyroid: no enlargement, no palpable nodules Pulmonary: No increased work of breathing, CTAB Cardiovascular: RRR, distal pulses 2+ Breast: Breast symmetrical, no tenderness, no palpable nodules or masses, no skin or nipple retraction present, no nipple discharge.  No axillary or supraclavicular lymphadenopathy. Abdomen: NABS, soft, non-tender, non-distended.  Umbilicus  without lesions.  No hepatomegaly, splenomegaly or masses palpable. No evidence of hernia  Genitourinary:  External: Normal external female genitalia.  Normal urethral meatus, normal Bartholin's and Skene's glands.    Vagina: Normal vaginal mucosa, no evidence of prolapse.    Cervix: Grossly normal in appearance, no bleeding  Uterus: Non-enlarged, mobile, normal contour.  No CMT  Adnexa: ovaries non-enlarged, no adnexal masses  Rectal: deferred  Lymphatic: no evidence of inguinal lymphadenopathy Extremities: no edema, erythema, or tenderness Neurologic: Grossly intact Psychiatric: mood appropriate, affect full  Female chaperone present for pelvic and breast  portions of the physical exam  GAD 7 : Generalized Anxiety Score  08/24/2018  Nervous, Anxious, on Edge 2  Control/stop worrying 3  Worry too much - different things 3  Trouble relaxing 3  Restless 1  Easily annoyed or irritable 2  Afraid - awful might happen 0  Total GAD 7 Score 14  Anxiety Difficulty Very difficult    Depression screen PHQ 2/9 08/24/2018  Decreased Interest 1  Down, Depressed, Hopeless 1  PHQ - 2 Score 2  Altered sleeping 2  Tired, decreased energy 3  Change in appetite 3  Feeling bad or failure about yourself  1  Trouble concentrating 0  Moving slowly or fidgety/restless 1  Suicidal thoughts 0  PHQ-9 Score 12  Difficult doing work/chores Very difficult    Assessment: 53 y.o. [id number] routine annual exam  Plan: Problem List Items Addressed This Visit    None    Visit Diagnoses    Encounter for gynecological examination without abnormal finding    -  Primary   Breast screening       Relevant Orders   MM 3D SCREEN BREAST BILATERAL   Screening for malignant neoplasm of cervix       Relevant Orders   PapIG, HPV, rfx 16/18   Generalized anxiety disorder       Relevant Medications   escitalopram (LEXAPRO) 10 MG tablet   Situational depression       Relevant Medications   escitalopram (LEXAPRO)  10 MG tablet      1) Mammogram - recommend yearly screening mammogram.  Mammogram Was ordered today  2) STI screening  was notoffered and therefore not obtained  3) ASCCP guidelines and rational discussed.  Patient opts for yearly screening interval  4) Osteoporosis  - per USPTF routine screening DEXA at age 53  5) Routine healthcare maintenance including cholesterol, diabetes screening discussed managed by PCP  6) Colonoscopy  - 11/04/2017 diagnostic colonoscopy following positive cologuard with mild chronic colitis  7) Husband diagnosed with lung cancer February - rx lexparo for anxiety  7) Return in about 4 weeks (around 09/21/2018) for medication follow up (phone).    Vena AustriaAndreas Lorissa Kishbaugh, MD [facility], [facility] 08/24/2018, 4:47 PM

## 2018-08-24 ENCOUNTER — Other Ambulatory Visit: Payer: Self-pay

## 2018-08-24 ENCOUNTER — Encounter

## 2018-08-24 ENCOUNTER — Ambulatory Visit (INDEPENDENT_AMBULATORY_CARE_PROVIDER_SITE_OTHER): Payer: Managed Care, Other (non HMO) | Admitting: Obstetrics and Gynecology

## 2018-08-24 ENCOUNTER — Encounter: Payer: Self-pay | Admitting: Obstetrics and Gynecology

## 2018-08-24 VITALS — BP 112/84 | HR 66 | Ht 67.0 in | Wt 218.0 lb

## 2018-08-24 DIAGNOSIS — Z1239 Encounter for other screening for malignant neoplasm of breast: Secondary | ICD-10-CM

## 2018-08-24 DIAGNOSIS — F4321 Adjustment disorder with depressed mood: Secondary | ICD-10-CM

## 2018-08-24 DIAGNOSIS — F411 Generalized anxiety disorder: Secondary | ICD-10-CM

## 2018-08-24 DIAGNOSIS — Z01419 Encounter for gynecological examination (general) (routine) without abnormal findings: Secondary | ICD-10-CM | POA: Diagnosis not present

## 2018-08-24 DIAGNOSIS — Z124 Encounter for screening for malignant neoplasm of cervix: Secondary | ICD-10-CM

## 2018-08-24 MED ORDER — ESCITALOPRAM OXALATE 10 MG PO TABS: 10.0000 mg | ORAL_TABLET | Freq: Every day | ORAL | 3 refills | Status: DC

## 2018-09-06 LAB — PAPIG, HPV, RFX 16/18: HPV, high-risk: NEGATIVE

## 2018-09-21 ENCOUNTER — Ambulatory Visit (INDEPENDENT_AMBULATORY_CARE_PROVIDER_SITE_OTHER): Payer: Managed Care, Other (non HMO) | Admitting: Obstetrics and Gynecology

## 2018-09-21 ENCOUNTER — Other Ambulatory Visit: Payer: Self-pay

## 2018-09-21 ENCOUNTER — Encounter: Payer: Self-pay | Admitting: Obstetrics and Gynecology

## 2018-09-21 DIAGNOSIS — F329 Major depressive disorder, single episode, unspecified: Secondary | ICD-10-CM

## 2018-09-21 NOTE — Progress Notes (Signed)
I connected with JANIAH DEVINNEY on 09/24/18 at  3:10 PM EDT by telephone and verified that I am speaking with the correct person using two identifiers.   I discussed the limitations, risks, security and privacy concerns of performing an evaluation and management service by telephone and the availability of in person appointments. I also discussed with the patient that there may be a patient responsible charge related to this service. The patient expressed understanding and agreed to proceed.  The patient was at home I spoke with the patient from my workstation phone The names of people involved in this encounter were: Arsenio Loader , and [facility] Visit   Chief Complaint:  Chief Complaint  Patient presents with  . Follow-up    Anxiety/depression    History of Present Illness: The patient is a 53 y.o. female presenting follow up for symptoms of depression.  The patient is currently taking Lexapro 10mg  for the management of her symptoms.  She has had recent situational stressors, husband diagnosed with lung cancer February 2020.  She reports good improvement and control symptoms on current pharmacotherapy. She denies anhedonia, day time somnolence, insomnia, risk taking behavior, irritability, social anxiety, agorophobia, feelings of guilt, feelings of worthlessness, suicidal ideation, homicidal ideation, auditory hallucinations and visual hallucinations. Symptoms have improved since last visit.     The patient does not have a pre-existing history of depression and anxiety.  She  does not a prior history of suicide attempts.    Review of Systems: Review of Systems  All other systems reviewed and are negative.   Past Medical History:  Past Medical History:  Diagnosis Date  . Allergy   . DVT (deep venous thrombosis) (HCC)    history of - Right arm  . 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 GERD (gastroesophageal reflux  disease)   . Hypertension   . Leaky heart valve   . Lupus ([place])   . Motion sickness    ocean ships  . Orthodontics    wears braces  . Pre-diabetes   . Sleep apnea    CPAP  . Urethral stricture   . Wears contact lenses     Past Surgical History:  Past Surgical History:  Procedure Laterality Date  . CESAREAN SECTION    . COLONOSCOPY WITH PROPOFOL N/A 11/04/2016   Procedure: COLONOSCOPY WITH PROPOFOL;  Surgeon: Lucilla Lame, MD;  Location: [place];  Service: Gastroenterology;  Laterality: N/A;  . CYST EXCISION     biopsy - neck  . VULVA /PERINEUM BIOPSY      Gynecologic History: No LMP recorded. Patient is postmenopausal.  Obstetric History: [id number]  Family History:  Family History  Problem Relation Age of Onset  . Kidney disease Mother        only one kidney from accident at childhood  . Lung cancer Father 85  . Bladder Cancer Neg Hx   . Breast cancer Neg Hx     Social History:  Social History   Socioeconomic History  . Marital status: Married    Spouse name: Not on file  . Number of children: Not on file  . Years of education: Not on file  . Highest education level: Not on file  Occupational History  . Not on file  Social Needs  . Financial resource strain: Not on file  . Food insecurity    Worry: Not on file    Inability:  Not on file  . Transportation needs    Medical: Not on file    Non-medical: Not on file  Tobacco Use  . Smoking status: Never Smoker  . Smokeless tobacco: Never Used  Substance and Sexual Activity  . Alcohol use: No  . Drug use: No  . Sexual activity: Yes    Birth control/protection: None  Lifestyle  . Physical activity    Days per week: 0 days    Minutes per session: 0 min  . Stress: Only a little  Relationships  . Social Musicianconnections    Talks on phone: Not on file    Gets together: Not on file    Attends religious service: Not on file    Active member of club or organization: Not on file    Attends meetings of  clubs or organizations: Not on file    Relationship status: Not on file  . Intimate partner violence    Fear of current or ex partner: Not on file    Emotionally abused: Not on file    Physically abused: Not on file    Forced sexual activity: Not on file  Other Topics Concern  . Not on file  Social History Narrative  . Not on file    Allergies:  Allergies  Allergen Reactions  . Ciprofloxacin Swelling  . Doxycycline Nausea Only and Other (See Comments)    GI Upset  . Gelatin     Alpha gal  . Meat Extract     Alpha-gal - all mammal products  . Milk-Related Compounds Hives and Swelling    Alpha gal  . Sulfa Antibiotics Hives    Medications: Prior to Admission medications   Medication Sig Start Date End Date Taking? Authorizing Provider  Clobetasol Propionate Emulsion 0.05 % topical foam APPLY TO THE AFFECTED AREA ONCE TO TWICE DAILY AS NEEDED FOR ITCH. AVOID FACE, GROIN, AND AXILLA 10/07/16   [provider]  clotrimazole-betamethasone (LOTRISONE) cream Apply 1 application topically 2 (two) times daily. Patient not taking: Reported on 07/14/2017 11/18/16   Felecia ShellingEvans, Brent M, DPM  diphenhydrAMINE (BENADRYL) 25 mg capsule Take 25 mg by mouth.    [provider]  escitalopram (LEXAPRO) 10 MG tablet Take 1 tablet (10 mg total) by mouth daily. 08/24/18   Vena AustriaStaebler, Braelon Sprung, MD  Hydroxychloroquine Sulfate (PLAQUENIL PO) Take by mouth.    [provider]  metoprolol succinate (TOPROL-XL) 25 MG 24 hr tablet Take 25 mg by mouth daily.    [provider]  terbinafine (LAMISIL) 250 MG tablet Take 1 tablet (250 mg total) by mouth daily. 11/18/16   Felecia ShellingEvans, Brent M, DPM    Physical Exam Vitals: There were no vitals filed for this visit. No LMP recorded. Patient is postmenopausal.  No physical exam as this was a remote telephone visit to promote social distancing during the current COVID-19 Pandemic  GAD 7 : Generalized Anxiety Score 09/21/2018 08/24/2018   Nervous, Anxious, on Edge 0 2  Control/stop worrying 1 3  Worry too much - different things 1 3  Trouble relaxing 1 3  Restless 0 1  Easily annoyed or irritable 0 2  Afraid - awful might happen 0 0  Total GAD 7 Score 3 14  Anxiety Difficulty - Very difficult    Depression screen [facility] 2/9 09/21/2018 09/21/2018 08/24/2018  Decreased Interest 0 0 1  Down, Depressed, Hopeless 0 0 1  PHQ - 2 Score 0 0 2  Altered sleeping 1 0 2  Tired, decreased  energy 0 0 3  Change in appetite 1 0 3  Feeling bad or failure about yourself  0 0 1  Trouble concentrating 0 0 0  Moving slowly or fidgety/restless 0 0 1  Suicidal thoughts 0 0 0  PHQ-9 Score 2 0 12  Difficult doing work/chores Not difficult at all - Very difficult    Depression screen [facility] 09/21/2018 09/21/2018 08/24/2018  Decreased Interest 0 0 1  Down, Depressed, Hopeless 0 0 1  PHQ - 2 Score 0 0 2  Altered sleeping 1 0 2  Tired, decreased energy 0 0 3  Change in appetite 1 0 3  Feeling bad or failure about yourself  0 0 1  Trouble concentrating 0 0 0  Moving slowly or fidgety/restless 0 0 1  Suicidal thoughts 0 0 0  PHQ-9 Score 2 0 12  Difficult doing work/chores Not difficult at all - Very difficult     Assessment: 53 y.o. [id number] follow up depression  Plan: Problem List Items Addressed This Visit    None    Visit Diagnoses    Reactive depression    -  Primary      1) Depression - large situational component with husband diagnosis of lung cancer.  Doing well on current dose of Lexapro  2) Thyroid and B12 screen has not been obtained previously  3) Telephone time 7 minutes   Vena AustriaAndreas Caroleann Casler, MD, Merlinda FrederickFACOG [facility], [facility] 09/21/2018, 3:50 PM

## 2018-09-25 IMAGING — MG MM DIGITAL SCREENING BILAT W/ TOMO W/ CAD
9 of 13 series · 9 of 29 positions shown · non-contrast
Comparison: Previous exam(s).

CLINICAL DATA: Screening.

EXAM:
2D DIGITAL SCREENING BILATERAL MAMMOGRAM WITH CAD AND ADJUNCT TOMO

[L MLO (1 of 2)]
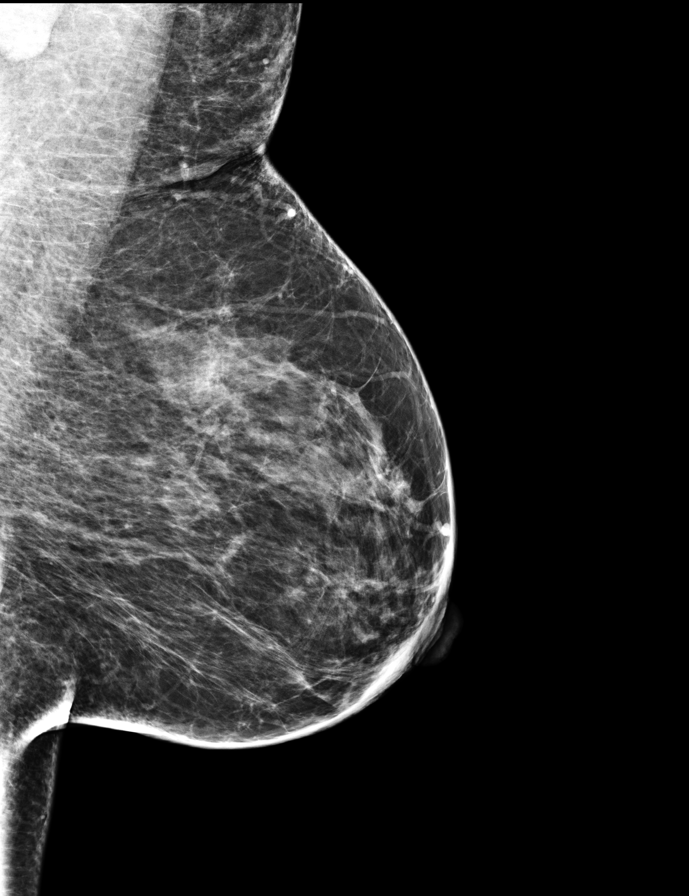

[L CC]
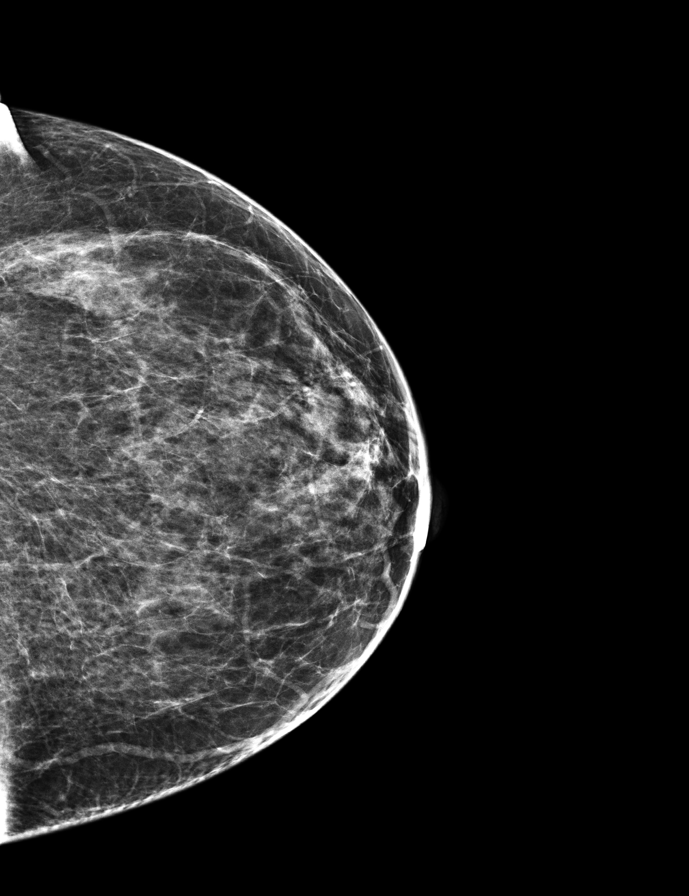

[R CC synth-2D]
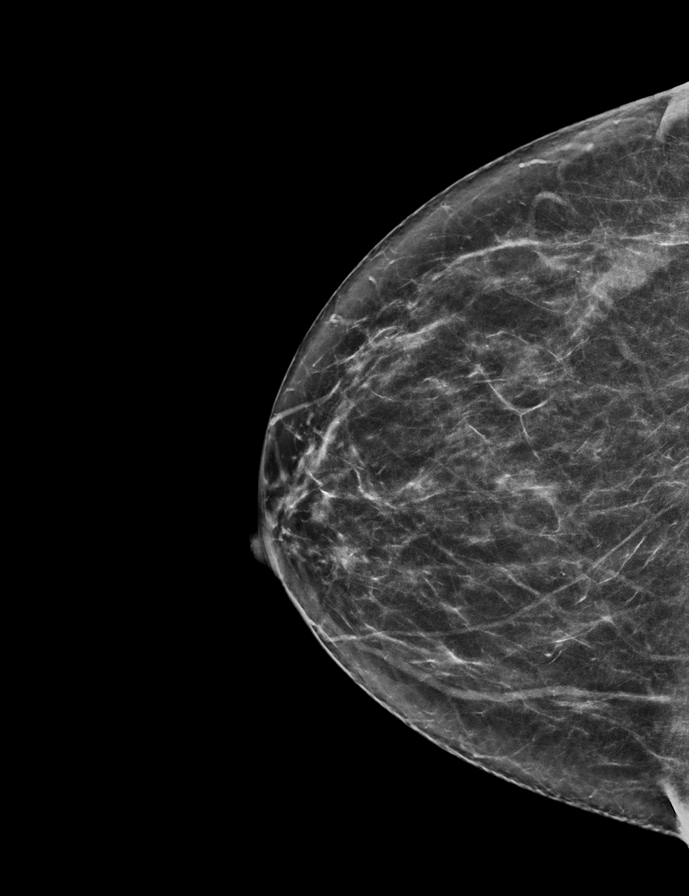

[L CC synth-2D]
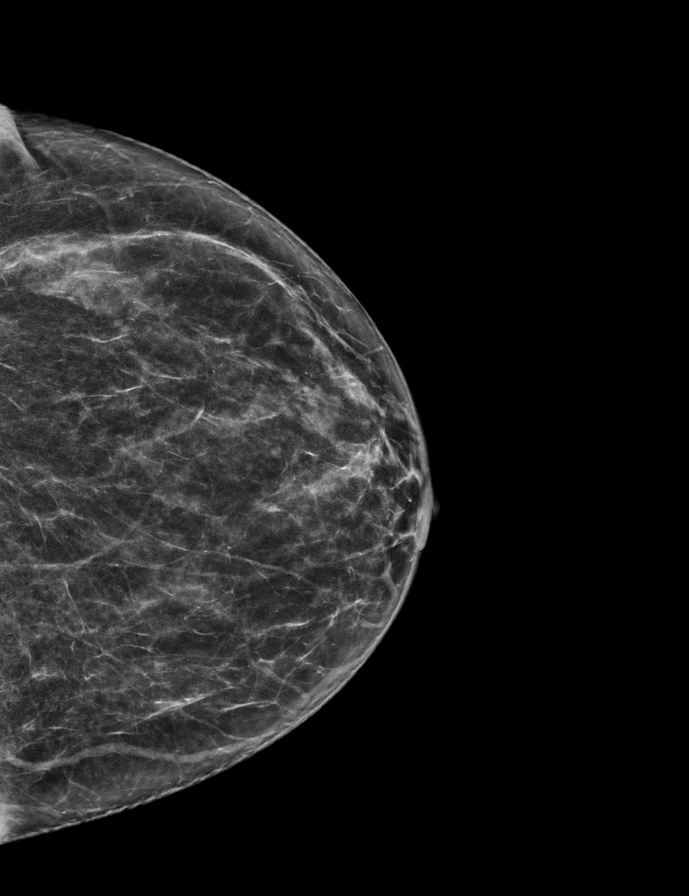

[R MLO synth-2D]
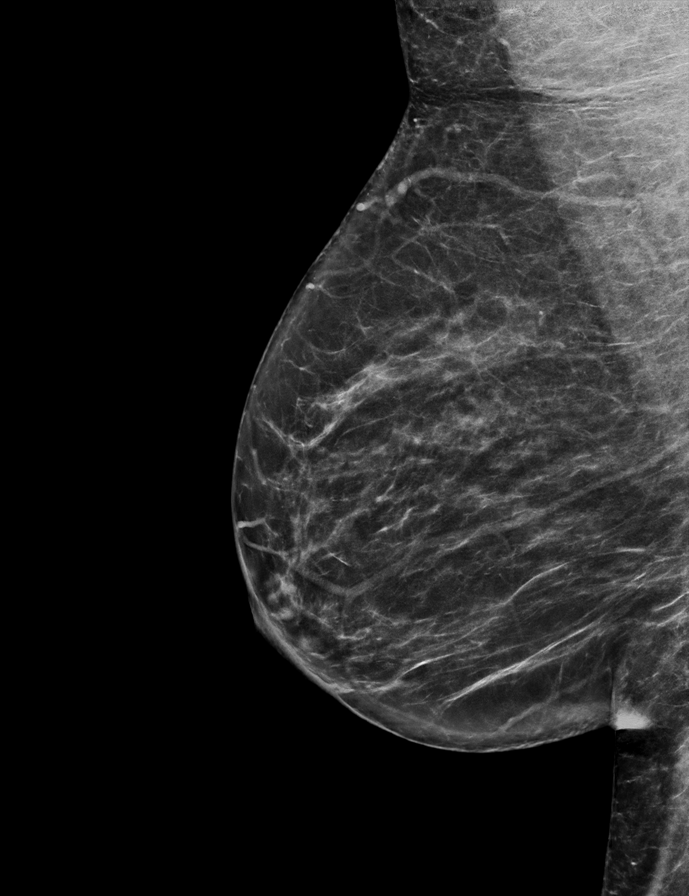

[L MLO synth-2D]
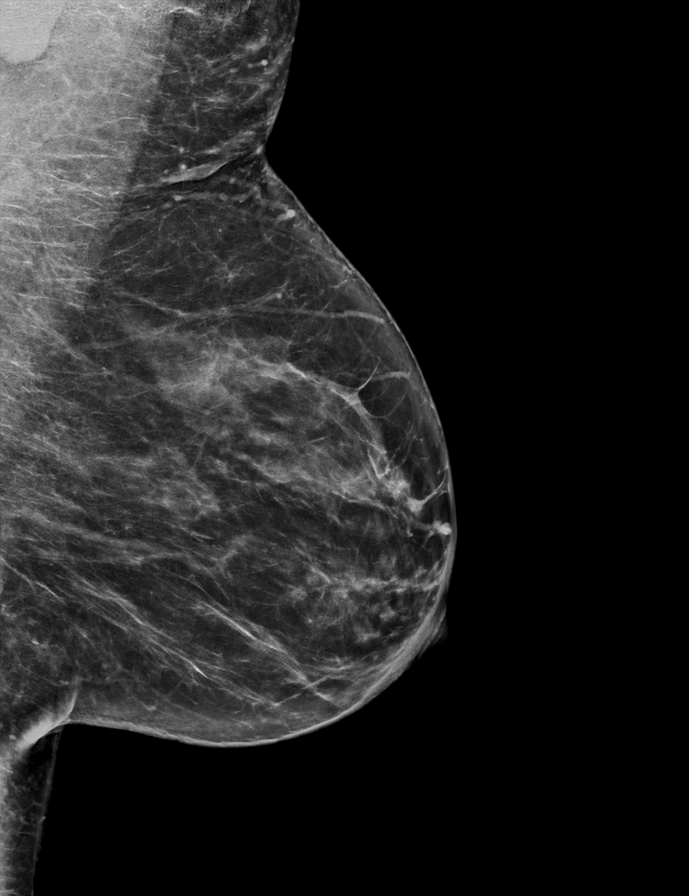

[R MLO]
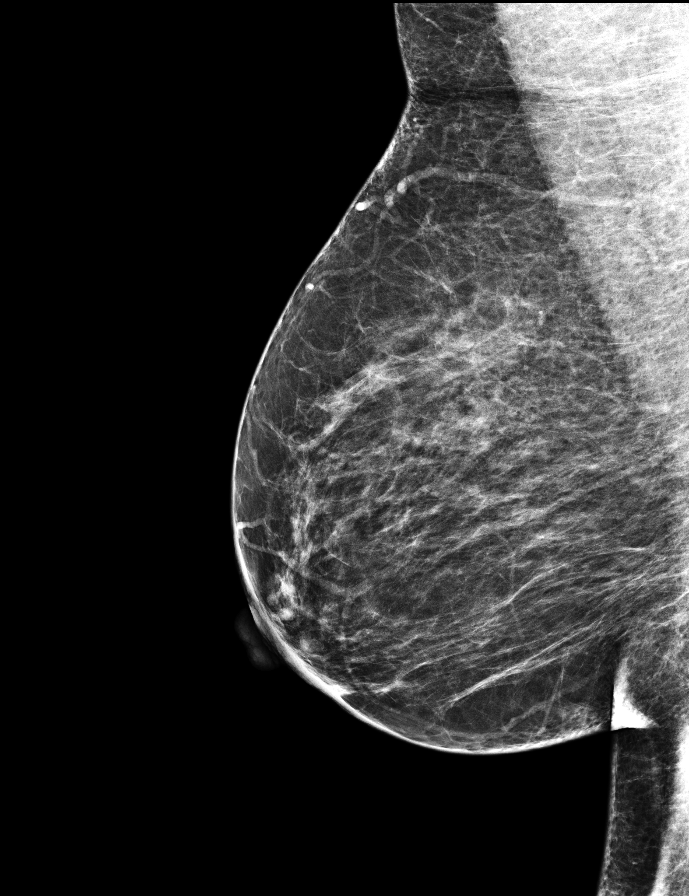

[R CC]
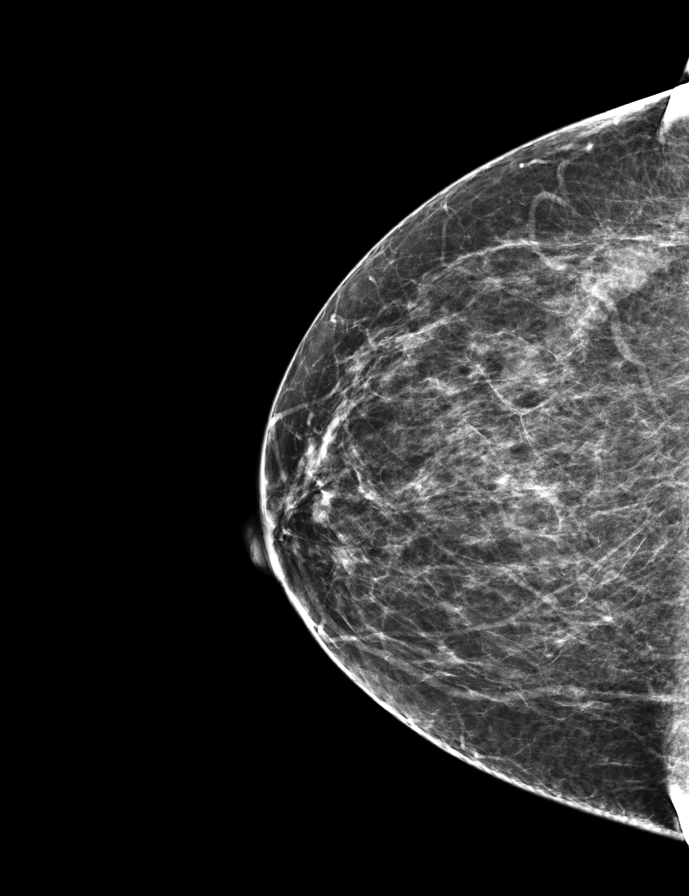

[L MLO (2 of 2)]
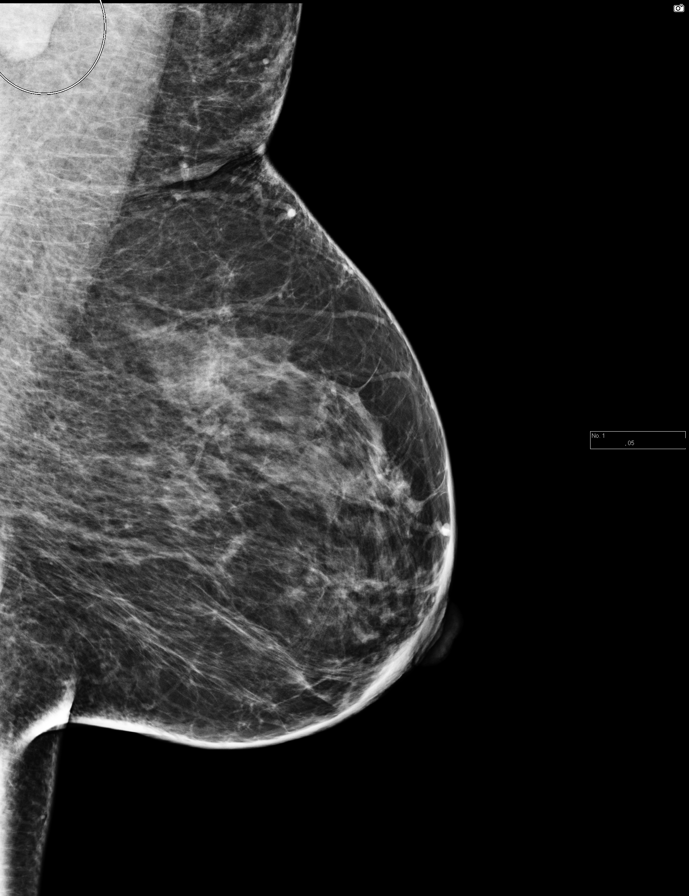

[9 of 29 positions shown; findings below may reference images not displayed]

ACR Breast Density Category b: There are scattered areas of
fibroglandular density.
FINDINGS: In the left axilla, possible lymphadenopathy warrants further
evaluation. Otherwise, the left breast is unremarkable. In the right
breast, no findings suspicious for malignancy.

Images were processed with CAD.
IMPRESSION: Further evaluation is suggested for possible left axillary
lymphadenopathy.

RECOMMENDATION:
Diagnostic mammogram and ultrasound of the left breast
(Code:[id number])

The patient will be contacted regarding the findings, and additional
imaging will be scheduled.

BI-RADS CATEGORY  0: Incomplete. Need additional imaging evaluation
and/or prior mammograms for comparison.

## 2019-02-08 ENCOUNTER — Ambulatory Visit
Admission: RE | Admit: 2019-02-08 | Discharge: 2019-02-08 | Disposition: A | Discharge status: Home / Self Care | Payer: Managed Care, Other (non HMO) | Source: Ambulatory Visit | Attending: Obstetrics and Gynecology | Admitting: Obstetrics and Gynecology

## 2019-02-08 DIAGNOSIS — Z1239 Encounter for other screening for malignant neoplasm of breast: Secondary | ICD-10-CM

## 2019-02-08 DIAGNOSIS — Z1231 Encounter for screening mammogram for malignant neoplasm of breast: Secondary | ICD-10-CM | POA: Insufficient documentation

## 2019-04-05 ENCOUNTER — Emergency Department
Admission: EM | Admit: 2019-04-05 | Discharge: 2019-04-06 | Disposition: A | Discharge status: Home / Self Care | Payer: Managed Care, Other (non HMO) | Attending: Emergency Medicine | Admitting: Emergency Medicine

## 2019-04-05 ENCOUNTER — Emergency Department: Payer: Managed Care, Other (non HMO)

## 2019-04-05 DIAGNOSIS — Z20822 Contact with and (suspected) exposure to covid-19: Secondary | ICD-10-CM | POA: Insufficient documentation

## 2019-04-05 DIAGNOSIS — J189 Pneumonia, unspecified organism: Secondary | ICD-10-CM | POA: Insufficient documentation

## 2019-04-05 DIAGNOSIS — R0602 Shortness of breath: Secondary | ICD-10-CM | POA: Diagnosis present

## 2019-04-05 DIAGNOSIS — I10 Essential (primary) hypertension: Secondary | ICD-10-CM | POA: Diagnosis not present

## 2019-04-05 DIAGNOSIS — Z79899 Other long term (current) drug therapy: Secondary | ICD-10-CM | POA: Diagnosis not present

## 2019-04-05 LAB — CBC WITH DIFFERENTIAL/PLATELET
Abs Immature Granulocytes: 0.1 10*3/uL — ABNORMAL HIGH (ref 0.00–0.07)
Basophils Absolute: 0 10*3/uL (ref 0.0–0.1)
Basophils Relative: 0 %
Eosinophils Absolute: 0.2 10*3/uL (ref 0.0–0.5)
Eosinophils Relative: 3 %
HCT: 36.7 % (ref 36.0–46.0)
Hemoglobin: 12.2 g/dL (ref 12.0–15.0)
Immature Granulocytes: 1 %
Lymphocytes Relative: 20 %
Lymphs Abs: 1.5 10*3/uL (ref 0.7–4.0)
MCH: 28.8 pg (ref 26.0–34.0)
MCHC: 33.2 g/dL (ref 30.0–36.0)
MCV: 86.8 fL (ref 80.0–100.0)
Monocytes Absolute: 0.5 10*3/uL (ref 0.1–1.0)
Monocytes Relative: 7 %
Neutro Abs: 5 10*3/uL (ref 1.7–7.7)
Neutrophils Relative %: 69 %
Platelets: 233 10*3/uL (ref 150–400)
RBC: 4.23 MIL/uL (ref 3.87–5.11)
RDW: 12.6 % (ref 11.5–15.5)
WBC: 7.3 10*3/uL (ref 4.0–10.5)
nRBC: 0 % (ref 0.0–0.2)

## 2019-04-05 MED ORDER — SODIUM CHLORIDE 0.9 % IV BOLUS
500.0000 mL | Freq: Once | INTRAVENOUS | Status: AC
  Administered 2019-04-05: 500 mL via INTRAVENOUS

## 2019-04-05 MED ORDER — BENZONATATE 100 MG PO CAPS
100.0000 mg | ORAL_CAPSULE | Freq: Once | ORAL | Status: AC
  Administered 2019-04-05: 100 mg via ORAL
  Filled 2019-04-05: qty 1

## 2019-04-05 MED ORDER — ACETAMINOPHEN 325 MG PO TABS
650.0000 mg | ORAL_TABLET | Freq: Once | ORAL | Status: AC
  Administered 2019-04-05: 650 mg via ORAL
  Filled 2019-04-05: qty 2

## 2019-04-05 NOTE — ED Provider Notes (Signed)
[facility] Emergency Department Provider Note  ____________________________________________   First MD Initiated Contact with Patient 04/05/19 2256     (approximate)  I have reviewed the triage vital signs and the nursing notes.   HISTORY  Chief Complaint Shortness of Breath and Chest Pain    HPI Jacqueline [name] is a 54 y.o. female with prior DVT in the right arm, lupus who comes in for shortness of breath.  Patient had shortness of breath since Sunday.  Patient states the shortness of breath has gotten worse, intermittent, worse with exertion, better at rest.  Does have a little bit of chest tightness but thinks is mostly from her coughing.  States it has been a little bit harder to sleep at nighttime mostly from coughing..  She is not on any steroids for her lupus.  She only gets steroids if she has a flareup.  She states that she had a prior DVT 10 years ago and they were unclear exactly why.  Has not had a recurrent blood clot since then.  No swelling in her arms or legs, no recent travel, no recent surgery.          Past Medical History:  Diagnosis Date  . Allergy   . DVT (deep venous thrombosis) (HCC)    history of - Right arm  . 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 GERD (gastroesophageal reflux disease)   . Hypertension   . Leaky heart valve   . Lupus (HCC)   . Motion sickness    ocean ships  . Orthodontics    wears braces  . Pre-diabetes   . Sleep apnea    CPAP  . Urethral stricture   . Wears contact lenses     Patient Active Problem List   Diagnosis Date Noted  . Positive colorectal cancer screening using Cologuard test   . Noninfectious gastroenteritis   . History of Lyme disease 12/09/2015  . Psoriasis 11/20/2015  . Recurrent UTI (urinary tract infection) 11/20/2015  . Systemic lupus (HCC) 11/20/2015  . History of shingles 04/24/2015  . Post herpetic neuralgia 04/24/2015  . Food allergy 12/11/2014  .  Mild obesity 10/03/2014  . Lymphadenopathy 09/19/2013  . Systemic lupus erythematosus (HCC) 06/13/2012  . 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 CESAREAN SECTION    . COLONOSCOPY WITH PROPOFOL N/A 11/04/2016   Procedure: COLONOSCOPY WITH PROPOFOL;  Surgeon: Midge Minium, MD;  Location: [facility];  Service: Gastroenterology;  Laterality: N/A;  . CYST EXCISION     biopsy - neck  . VULVA /PERINEUM BIOPSY      Prior to Admission medications   Medication Sig Start Date End Date Taking? Authorizing Provider  Clobetasol Propionate Emulsion 0.05 % topical foam APPLY TO THE AFFECTED AREA ONCE TO TWICE DAILY AS NEEDED FOR ITCH. AVOID FACE, GROIN, AND AXILLA 10/07/16   [provider]  clotrimazole-betamethasone (LOTRISONE) cream Apply 1 application topically 2 (two) times daily. Patient not taking: Reported on 07/14/2017 11/18/16   Felecia Shelling, DPM  diphenhydrAMINE (BENADRYL) 25 mg capsule Take 25 mg by mouth.    [provider]  escitalopram (LEXAPRO) 10 MG tablet Take 1 tablet (10 mg total) by mouth daily. 08/24/18   Vena Austria, MD  Hydroxychloroquine Sulfate (PLAQUENIL PO) Take by mouth.    [provider]  metoprolol succinate (TOPROL-XL) 25 MG 24 hr tablet  Take 25 mg by mouth daily.    [provider]  terbinafine (LAMISIL) 250 MG tablet Take 1 tablet (250 mg total) by mouth daily. 11/18/16   Felecia Shelling, DPM    Allergies Ciprofloxacin, Doxycycline, Gelatin, Meat extract, Milk-related compounds, and Sulfa antibiotics  Family History  Problem Relation Age of Onset  . Kidney disease Mother        only one kidney from accident at childhood  . Lung cancer Father 72  . Bladder Cancer Neg Hx   . Breast cancer Neg Hx     Social History Social History   Tobacco Use  . Smoking status: Never Smoker  . Smokeless tobacco: Never Used  Substance Use Topics    . Alcohol use: No  . Drug use: No      Review of Systems Constitutional: No fever/chills Eyes: No visual changes. ENT: No sore throat. Cardiovascular: No chest pain Respiratory: Positive for SOB, cough  Gastrointestinal: No abdominal pain.  No nausea, no vomiting.  No diarrhea.  No constipation. Genitourinary: Negative for dysuria. Musculoskeletal: Negative for back pain. Skin: Negative for rash. Neurological: Negative for headaches, focal weakness or numbness. All other ROS negative ____________________________________________   PHYSICAL EXAM:  VITAL SIGNS: ED Triage Vitals  Enc Vitals Group     BP 04/05/19 2252 (!) 152/82     Pulse Rate 04/05/19 2255 84     Resp 04/05/19 2252 20     Temp 04/05/19 2255 99 F (37.2 C)     Temp Source 04/05/19 2255 Oral     SpO2 --      Weight 04/05/19 2250 220 lb (99.8 kg)     Height 04/05/19 2250 5\' 7"  (1.702 m)     Head Circumference --      Peak Flow --      Pain Score 04/05/19 2249 5     Pain Loc --      Pain Edu? --      Excl. in GC? --     Constitutional: Alert and oriented. Well appearing and in no acute distress. Eyes: Conjunctivae are normal. EOMI. Head: Atraumatic. Nose: No congestion/rhinnorhea. Mouth/Throat: Mucous membranes are moist.   Neck: No stridor. Trachea Midline. FROM Cardiovascular: Normal rate, regular rhythm. Grossly normal heart sounds.  Good peripheral circulation. Respiratory: No increased work of breathing, clear lungs Gastrointestinal: Soft and nontender. No distention. No abdominal bruits.  Musculoskeletal: No lower extremity tenderness nor edema.  No joint effusions. Neurologic:  Normal speech and language. No gross focal neurologic deficits are appreciated.  Skin:  Skin is warm, dry and intact. No rash noted. Psychiatric: Mood and affect are normal. Speech and behavior are normal. GU: Deferred   ____________________________________________   LABS (all labs ordered are listed, but only  abnormal results are displayed)  Labs Reviewed  CBC WITH DIFFERENTIAL/PLATELET - Abnormal; Notable for the following components:      Result Value   Abs Immature Granulocytes 0.10 (*)    All other components within normal limits  COMPREHENSIVE METABOLIC PANEL - Abnormal; Notable for the following components:   Glucose, Bld 113 (*)    Calcium 8.7 (*)    Albumin 3.0 (*)    All other components within normal limits  FIBRIN DERIVATIVES D-DIMER ([facility] ONLY) - Abnormal; Notable for the following components:   Fibrin derivatives D-dimer ([facility]) 1,037.16 (*)    All other components within normal limits  URINALYSIS, ROUTINE W REFLEX MICROSCOPIC - Abnormal; Notable for the following components:   Color,  Urine STRAW (*)    APPearance CLEAR (*)    All other components within normal limits  RESPIRATORY PANEL BY RT PCR (FLU A&B, COVID)  SARS CORONAVIRUS 2 (TAT 6-24 HRS)  LIPASE, BLOOD  BRAIN NATRIURETIC PEPTIDE  PROCALCITONIN  PROCALCITONIN  TROPONIN I (HIGH SENSITIVITY)  TROPONIN I (HIGH SENSITIVITY)   ____________________________________________   ED ECG REPORT I, Concha Se, the attending physician, personally viewed and interpreted this ECG.  EKG is normal sinus rate of 74, no ST elevation, no T wave inversions, normal intervals ____________________________________________  RADIOLOGY Vela Prose, personally viewed and evaluated these images (plain radiographs) as part of my medical decision making, as well as reviewing the written report by the radiologist.  ED MD interpretation: Bilateral opacifications concerning for Covid pneumonia  Official radiology report(s): CT Angio Chest PE W and/or Wo Contrast  Result Date: 04/06/2019 CLINICAL DATA:  Shortness of breath EXAM: CT ANGIOGRAPHY CHEST WITH CONTRAST TECHNIQUE: Multidetector CT imaging of the chest was performed using the standard protocol during bolus administration of intravenous contrast. Multiplanar CT image  reconstructions and MIPs were obtained to evaluate the vascular anatomy. CONTRAST:  72mL OMNIPAQUE IOHEXOL 350 MG/ML SOLN COMPARISON:  CT October 29, 2010 FINDINGS: Cardiovascular: Satisfactory opacification the pulmonary arteries to the segmental level. No pulmonary artery filling defects are identified. Central pulmonary arteries are normal caliber. Normal heart size. No pericardial effusion. The aorta is normal caliber. Normally branching great vessels are free of acute abnormality. Mediastinum/Nodes: There is extensive mediastinal and hilar adenopathy. Examples include a 10 mm AP window lymph node (4/30), a 14 mm precarinal lymph node (4/34), and a 12 mm right hilar lymph node (4/40). Lungs/Pleura: There are mixed areas of patchy ground-glass and more centrilobular consolidative opacities predominantly throughout both lungs but with a notable predominance in the superior segment lower lobes. No pneumothorax. No effusion. Upper Abdomen: No acute abnormalities present in the visualized portions of the upper abdomen. Musculoskeletal: No acute osseous abnormality or suspicious osseous lesion. Multilevel degenerative changes are present in the imaged portions of the spine. Multilevel flowing anterior osteophytosis, compatible with features of diffuse idiopathic skeletal hyperostosis (DISH). Review of the MIP images confirms the above findings. IMPRESSION: 1. No evidence of pulmonary artery filling defects. 2. Mixed areas of patchy ground-glass and more centrilobular consolidative opacities predominantly throughout both lungs but with a notable predominance in the superior segment lower lobes. Findings could reflect a multifocal pneumonia or atypical infection. 3. Extensive mediastinal and hilar adenopathy is favored to be reactive in etiology. Electronically Signed   By: Kreg Shropshire M.D.   On: 04/06/2019 01:11   DG Chest Portable 1 View  Result Date: 04/05/2019 CLINICAL DATA:  Shortness of breath EXAM: PORTABLE  CHEST 1 VIEW COMPARISON:  12/16/2012 FINDINGS: Patchy consolidation right base. Diffuse left greater than right hazy opacity. Borderline to mild cardiomegaly. No pneumothorax. IMPRESSION: Bilateral left greater than right hazy pulmonary opacity with patchy consolidation at the right base. Consider bilateral pneumonia, possible atypical or viral pneumonia. Mild cardiomegaly. Electronically Signed   By: Jasmine Pang M.D.   On: 04/05/2019 23:18    ____________________________________________   PROCEDURES  Procedure(s) performed (including Critical Care):  Procedures   ____________________________________________   INITIAL IMPRESSION / ASSESSMENT AND PLAN / ED COURSE   LEATRICE PARILLA was evaluated in Emergency Department on 04/05/2019 for the symptoms described in the history of present illness. She was evaluated in the context of the global COVID-19 pandemic, which necessitated consideration that the patient  might be at risk for infection with the SARS-CoV-2 virus that causes COVID-19. Institutional protocols and algorithms that pertain to the evaluation of patients at risk for COVID-19 are in a state of rapid change based on information released by regulatory bodies including the CDC and federal and state organizations. These policies and algorithms were followed during the patient's care in the ED.     Pt presents with SOB. Differential includes: PNA-will get xray to evaluation Anemia-CBC to evaluate ACS- will get trops Arrhythmia-Will get EKG and keep on monitor.  COVID- will get testing per algorithm. PE-low Wells score's but will get D-dimer to evaluate given history of DVT  Labs are reassuring with negative cardiac markers.  D-dimer was elevated therefore CT PE was done.  CT PE without evidence of PE.   However patient had pulmonary opacities bilaterally left greater than right concerning for pneumonia versus viral pneumonia.  Procalcitonin was negative so I suspect that this is  related to Covid.  Coronavirus test was negative.  However I did explain to patient that I had a few negative coronavirus testing with very high suspicion for it.  We will send out the 24-hour Covid test.  I discussed with patient that the safest thing would be to quarantine for the next 10 days.  However given the test was negative I will start her on azithromycin due to doxycycline allergy and give a dose of ceftriaxone  in case this is related to bacterial pneumonia.  We discussed a cough syrup with codeine in it to use at nighttime due to not driving on it.  We discussed symptomatic management otherwise.  Patient felt comfortable with this plan.  Patient ambulate around the room with saturations above 95%.  I discussed the provisional nature of ED diagnosis, the treatment so far, the ongoing plan of care, follow up appointments and return precautions with the patient and any family or support people present. They expressed understanding and agreed with the plan, discharged home.    ____________________________________________   FINAL CLINICAL IMPRESSION(S) / ED DIAGNOSES   Final diagnoses:  Community acquired pneumonia, unspecified laterality  Suspected COVID-19 virus infection     MEDICATIONS GIVEN DURING THIS VISIT:  Medications  sodium chloride 0.9 % bolus 500 mL (0 mLs Intravenous Stopped 04/06/19 0100)  acetaminophen (TYLENOL) tablet 650 mg (650 mg Oral Given 04/05/19 2326)  benzonatate (TESSALON) capsule 100 mg (100 mg Oral Given 04/05/19 2326)  iohexol (OMNIPAQUE) 350 MG/ML injection 75 mL (75 mLs Intravenous Contrast Given 04/06/19 0043)  cefTRIAXone (ROCEPHIN) 1 g in sodium chloride 0.9 % 100 mL IVPB (0 g Intravenous Stopped 04/06/19 0146)     ED Discharge Orders         Ordered    azithromycin (ZITHROMAX Z-PAK) 250 MG tablet     04/06/19 0318    guaiFENesin-codeine (ROBITUSSIN AC) 100-10 MG/5ML syrup  3 times daily PRN     04/06/19 0318           Note:  This document  was prepared using Dragon voice recognition software and may include unintentional dictation errors.   Vanessa Lake W[place], MD 04/06/19 [phone]

## 2019-04-05 NOTE — ED Triage Notes (Signed)
Shortness of breath since Sunday. States it is getting worse. She states that she has a mold problem in her basement and wonders if that is the problem. Denies current chest pain but states when she coughs it makes her chest feel tight.

## 2019-04-06 ENCOUNTER — Encounter: Payer: Self-pay | Admitting: Radiology

## 2019-04-06 ENCOUNTER — Emergency Department: Payer: Managed Care, Other (non HMO)

## 2019-04-06 LAB — COMPREHENSIVE METABOLIC PANEL
ALT: 24 U/L (ref 0–44)
AST: 27 U/L (ref 15–41)
Albumin: 3 g/dL — ABNORMAL LOW (ref 3.5–5.0)
Alkaline Phosphatase: 103 U/L (ref 38–126)
Anion gap: 10 (ref 5–15)
BUN: 14 mg/dL (ref 6–20)
CO2: 22 mmol/L (ref 22–32)
Calcium: 8.7 mg/dL — ABNORMAL LOW (ref 8.9–10.3)
Chloride: 108 mmol/L (ref 98–111)
Creatinine, Ser: 0.8 mg/dL (ref 0.44–1.00)
GFR calc Af Amer: >60 mL/min (ref >60)
GFR calc non Af Amer: >60 mL/min (ref >60)
Glucose, Bld: 113 mg/dL — ABNORMAL HIGH (ref 70–99)
Potassium: 3.9 mmol/L (ref 3.5–5.1)
Sodium: 140 mmol/L (ref 135–145)
Total Bilirubin: 0.8 mg/dL (ref 0.3–1.2)
Total Protein: 8 g/dL (ref 6.5–8.1)

## 2019-04-06 LAB — RESPIRATORY PANEL BY RT PCR (FLU A&B, COVID)
Influenza A by PCR: NEGATIVE
Influenza B by PCR: NEGATIVE
SARS Coronavirus 2 by RT PCR: NEGATIVE

## 2019-04-06 LAB — TROPONIN I (HIGH SENSITIVITY)
Troponin I (High Sensitivity): 9 ng/L (ref <18)
Troponin I (High Sensitivity): 10 ng/L (ref <18)

## 2019-04-06 LAB — URINALYSIS, ROUTINE W REFLEX MICROSCOPIC
Bilirubin Urine: NEGATIVE
Glucose, UA: NEGATIVE mg/dL
Hgb urine dipstick: NEGATIVE
Ketones, ur: NEGATIVE mg/dL
Leukocytes,Ua: NEGATIVE
Nitrite: NEGATIVE
Protein, ur: NEGATIVE mg/dL
Specific Gravity, Urine: 1.029 (ref 1.005–1.030)
pH: 7 (ref 5.0–8.0)

## 2019-04-06 LAB — PROCALCITONIN: Procalcitonin: 0.11 ng/mL

## 2019-04-06 LAB — SARS CORONAVIRUS 2 (TAT 6-24 HRS): SARS Coronavirus 2: NEGATIVE

## 2019-04-06 LAB — BRAIN NATRIURETIC PEPTIDE: B Natriuretic Peptide: 37 pg/mL (ref 0.0–100.0)

## 2019-04-06 LAB — LIPASE, BLOOD: Lipase: 29 U/L (ref 11–51)

## 2019-04-06 LAB — FIBRIN DERIVATIVES D-DIMER (ARMC ONLY): Fibrin derivatives D-dimer (ARMC): 1037.16 ng/mL (FEU) — ABNORMAL HIGH (ref 0.00–499.00)

## 2019-04-06 MED ORDER — IOHEXOL 350 MG/ML SOLN
75.0000 mL | Freq: Once | INTRAVENOUS | Status: AC | PRN
  Administered 2019-04-06: 75 mL via INTRAVENOUS

## 2019-04-06 MED ORDER — AZITHROMYCIN 250 MG PO TABS: ORAL_TABLET | ORAL | 0 refills | Status: AC

## 2019-04-06 MED ORDER — SODIUM CHLORIDE 0.9 % IV SOLN
1.0000 g | Freq: Once | INTRAVENOUS | Status: AC
  Administered 2019-04-06: 1 g via INTRAVENOUS
  Filled 2019-04-06: qty 10

## 2019-04-06 MED ORDER — GUAIFENESIN-CODEINE 100-10 MG/5ML PO SYRP: 5.0000 mL | ORAL_SOLUTION | Freq: Three times a day (TID) | ORAL | 0 refills | Status: DC | PRN

## 2019-04-06 NOTE — Discharge Instructions (Addendum)
Although your Covid test was negative I have very high suspicion that this is coronavirus.  You should stay quarantine at home for the next 10 days.  I am giving you antibiotics in case this is a bacterial infection.  We also give you some codeine cough syrup to use at nighttime.  Do not drive while on this.  Return to the ER if you develop worsening shortness of breath   IMPRESSION:  1. No evidence of pulmonary artery filling defects.  2. Mixed areas of patchy ground-glass and more centrilobular  consolidative opacities predominantly throughout both lungs but with  a notable predominance in the superior segment lower lobes. Findings  could reflect a multifocal pneumonia or atypical infection.  3. Extensive mediastinal and hilar adenopathy is favored to be  reactive in etiology.

## 2019-04-06 NOTE — ED Notes (Signed)
This RN ambulated with pt around the room and to the bathroom. Prior to ambulation pt SpO2 was 97%, during ambulation pt SpO2 was between 95-96% with increased coughing and SOB. Pt ambulated with steady gait and no assistance needed.

## 2019-06-10 ENCOUNTER — Other Ambulatory Visit: Payer: Self-pay

## 2019-06-10 ENCOUNTER — Encounter: Payer: Self-pay | Admitting: Podiatry

## 2019-06-10 ENCOUNTER — Ambulatory Visit (INDEPENDENT_AMBULATORY_CARE_PROVIDER_SITE_OTHER): Payer: Managed Care, Other (non HMO) | Admitting: Podiatry

## 2019-06-10 VITALS — Temp 98.4°F

## 2019-06-10 DIAGNOSIS — R234 Changes in skin texture: Secondary | ICD-10-CM | POA: Diagnosis not present

## 2019-06-10 DIAGNOSIS — L853 Xerosis cutis: Secondary | ICD-10-CM

## 2019-06-10 MED ORDER — CLOTRIMAZOLE-BETAMETHASONE 1-0.05 % EX CREA: 1.0000 "application " | TOPICAL_CREAM | Freq: Two times a day (BID) | CUTANEOUS | 0 refills | Status: DC

## 2019-06-11 ENCOUNTER — Encounter: Payer: Self-pay | Admitting: Podiatry

## 2019-06-11 NOTE — Progress Notes (Signed)
Subjective:  Patient ID: Jacqueline [name], female    DOB: Nov 12, 1965,  MRN: [id number]  Chief Complaint  Patient presents with  . Foot Pain    Patient presents today for bilat feet, cracking, peeling at the heels and pain at times.      54 y.o. female presents with the above complaint. Patient presents with bilateral xerosis/dry skin to the plantar aspect with associated itching.  Patient states that this has been going on for quite some time now.  She states that she was seen here in the past for pretty much the same thing by Dr. Amalia Hailey who recommended ammonium lactate/Lotrisone cream.  She states that this did help but it did not make it go away.  She has been soaking her feet but she has not been applying lotion afterwards.  I explained to her the importance of lotion and to apply it/moisturize twice a day.  She states that she would do so.  She denies any other acute complaints.   Review of Systems: Negative except as noted in the HPI. Denies N/V/F/Ch.  Past Medical History:  Diagnosis Date  . Allergy   . DVT (deep venous thrombosis) (HCC)    history of - Right arm  . 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 GERD (gastroesophageal reflux disease)   . Hypertension   . Leaky heart valve   . Lupus ([place])   . Motion sickness    ocean ships  . Orthodontics    wears braces  . Pre-diabetes   . Sleep apnea    CPAP  . Urethral stricture   . Wears contact lenses     Current Outpatient Medications:  .  azelastine (ASTELIN) 0.1 % nasal spray, Place into the nose., Disp: , Rfl:  .  clindamycin (CLEOCIN) 300 MG capsule, 300mg  twice daily, Disp: , Rfl:  .  EPINEPHrine 0.3 mg/0.3 mL IJ SOAJ injection, Inject into the muscle., Disp: , Rfl:  .  ibuprofen (ADVIL) 600 MG tablet, As needed, Disp: , Rfl:  .  metoprolol tartrate (LOPRESSOR) 25 MG tablet, Take by mouth., Disp: , Rfl:  .  Clobetasol Propionate Emulsion 0.05 % topical foam, APPLY TO THE AFFECTED AREA ONCE TO TWICE  DAILY AS NEEDED FOR ITCH. AVOID FACE, GROIN, AND AXILLA, Disp: , Rfl: 2 .  clotrimazole-betamethasone (LOTRISONE) cream, Apply 1 application topically 2 (two) times daily., Disp: 30 g, Rfl: 0 .  diphenhydrAMINE (BENADRYL) 25 mg capsule, Take 25 mg by mouth., Disp: , Rfl:  .  famotidine (PEPCID) 40 MG tablet, Take by mouth., Disp: , Rfl:  .  Hydroxychloroquine Sulfate (PLAQUENIL PO), Take by mouth., Disp: , Rfl:  .  phentermine (ADIPEX-P) 37.5 MG tablet, PLEASE SEE ATTACHED FOR DETAILED DIRECTIONS, Disp: , Rfl:   Social History   Tobacco Use  Smoking Status Never Smoker  Smokeless Tobacco Never Used    Allergies  Allergen Reactions  . Ciprofloxacin Swelling  . Doxycycline Nausea Only and Other (See Comments)    GI Upset  . Gelatin     Alpha gal  . Meat Extract     Alpha-gal - all mammal products  . Milk-Related Compounds Hives and Swelling    Alpha gal  . Sulfa Antibiotics Hives   Objective:   Vitals:   06/10/19 1542  Temp: 98.4 F (36.9 C)   There is no height or weight on file to calculate BMI. Constitutional Well developed. Well nourished.  Vascular Dorsalis pedis pulses palpable bilaterally.  Posterior tibial pulses palpable bilaterally. Capillary refill normal to all digits.  No cyanosis or clubbing noted. Pedal hair growth normal.  Neurologic Normal speech. Oriented to person, place, and time. Epicritic sensation to light touch grossly present bilaterally.  Dermatologic  moderate to severe dry skin noted to plantar aspect of both feet.  Subjective itching associated with it.  No scaling/epidermal lysis noted.  No other dermatological manifestations noted.  Orthopedic: Normal joint ROM without pain or crepitus bilaterally. No visible deformities. No bony tenderness.   Radiographs: None Assessment:   1. Xerosis of skin   2. Fissure in skin    Plan:  Patient was evaluated and treated and all questions answered.  Bilateral moderate to severe xerosis with  associated itching -I explained to the patient the etiology of xerosis and various treatment options were discussed.  I explained the the importance of moisturization twice a day with over-the-counter lotion for dry skin.  Patient states understanding will try to do that twice a day. -Given that there is also associated itching present with it I believe there might be a fungal component that could be involved.  I believe she will benefit from clobetasol betamethasone cream. -The cream was dispensed to his pharmacy. No follow-ups on file.

## 2019-08-17 ENCOUNTER — Other Ambulatory Visit: Payer: Self-pay | Admitting: Podiatry

## 2019-08-23 DIAGNOSIS — R0602 Shortness of breath: Secondary | ICD-10-CM | POA: Insufficient documentation

## 2019-08-23 DIAGNOSIS — R0789 Other chest pain: Secondary | ICD-10-CM | POA: Insufficient documentation

## 2019-08-27 ENCOUNTER — Ambulatory Visit: Payer: Managed Care, Other (non HMO) | Admitting: Obstetrics and Gynecology

## 2019-08-27 ENCOUNTER — Ambulatory Visit (INDEPENDENT_AMBULATORY_CARE_PROVIDER_SITE_OTHER): Payer: Managed Care, Other (non HMO) | Admitting: Obstetrics and Gynecology

## 2019-08-27 ENCOUNTER — Encounter: Payer: Self-pay | Admitting: Obstetrics and Gynecology

## 2019-08-27 ENCOUNTER — Other Ambulatory Visit: Payer: Self-pay

## 2019-08-27 VITALS — BP 130/86 | HR 81 | Ht 67.5 in | Wt 221.0 lb

## 2019-08-27 DIAGNOSIS — Z1239 Encounter for other screening for malignant neoplasm of breast: Secondary | ICD-10-CM | POA: Diagnosis not present

## 2019-08-27 DIAGNOSIS — Z01419 Encounter for gynecological examination (general) (routine) without abnormal findings: Secondary | ICD-10-CM

## 2019-08-27 DIAGNOSIS — Z124 Encounter for screening for malignant neoplasm of cervix: Secondary | ICD-10-CM

## 2019-08-27 NOTE — Addendum Note (Signed)
Addended by: Lorrene [name] on: 08/27/2019 11:53 AM   Modules accepted: Orders

## 2019-08-27 NOTE — Patient Instructions (Signed)
[facility] [address] [place] [address]  [facility]  [address] [place] [address]  Phone: [phone]  

## 2019-08-27 NOTE — Progress Notes (Signed)
Gynecology Annual Exam  PCP: [facility]  Chief Complaint:  Chief Complaint  Patient presents with  . Gynecologic Exam    History of Present Illness:Patient is a 55 y.o. [id number] presents for annual exam. The patient has no complaints today.   LMP: No LMP recorded. Patient is postmenopausal. No PMB   The patient does perform self breast exams.  There is no notable family history of breast or ovarian cancer in her family.  The patient wears seatbelts: yes.   The patient has regular exercise: not asked.    The patient denies current symptoms of depression.   Husband doing well after lung cancer diagnosis last year.    Review of Systems: Review of Systems  Constitutional: Negative for chills and fever.  HENT: Negative for congestion.   Respiratory: Negative for cough and shortness of breath.   Cardiovascular: Negative for chest pain and palpitations.  Gastrointestinal: Negative for abdominal pain, constipation, diarrhea, heartburn, nausea and vomiting.  Genitourinary: Negative for dysuria, frequency and urgency.  Skin: Negative for itching and rash.  Neurological: Negative for dizziness and headaches.  Endo/Heme/Allergies: Negative for polydipsia.  Psychiatric/Behavioral: Negative for depression.    Past Medical History:  Patient Active Problem List   Diagnosis Date Noted  . Hypertension, essential 09/20/2017  . Positive colorectal cancer screening using Cologuard test   . Noninfectious gastroenteritis   . History of Lyme disease 12/09/2015    Overview:  2017 + IGG neg IGM western blot  Treated with 28 days doxy    . Psoriasis 11/20/2015  . Recurrent UTI (urinary tract infection) 11/20/2015  . Systemic lupus (HCC) 11/20/2015    Overview:  a. Prednisone.  b. Plaquenil.  c.  Positive FANA, anti-DNA, low complement.  d. Neutropenia, anemia.  [facility])   . History of shingles 04/24/2015  . Post herpetic neuralgia 04/24/2015  . Food allergy 12/11/2014  . Mild  obesity 10/03/2014  . Lymphadenopathy 09/19/2013  . Systemic lupus erythematosus (HCC) 06/13/2012  . 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 CESAREAN SECTION    . COLONOSCOPY WITH PROPOFOL N/A 11/04/2016   Procedure: COLONOSCOPY WITH PROPOFOL;  Surgeon: Midge Minium, MD;  Location: [facility];  Service: Gastroenterology;  Laterality: N/A;  . CYST EXCISION     biopsy - neck  . VULVA /PERINEUM BIOPSY      Gynecologic History:  No LMP recorded. Patient is postmenopausal. Last Pap: Results were: 08/24/2018 NIL and HR HPV negative  Last mammogram: 02/08/2019 Results were: BI-RAD I  Obstetric History: [id number]  Family History:  Family History  Problem Relation Age of Onset  . Kidney disease Mother        only one kidney from accident at childhood  . Lung cancer Father 50  . Bladder Cancer Neg Hx   . Breast cancer Neg Hx     Social History:  Social History   Socioeconomic History  . Marital status: Married    Spouse name: Not on file  . Number of children: Not on file  . Years of education: Not on file  . Highest education level: Not on file  Occupational History  . Not on file  Tobacco Use  . Smoking status: Never Smoker  . Smokeless tobacco: Never Used  Vaping Use  . Vaping Use: Never used  Substance and Sexual Activity  . Alcohol use: No  . Drug use: No  .  Sexual activity: Yes    Birth control/protection: None  Other Topics Concern  . Not on file  Social History Narrative  . Not on file   Social Determinants of Health   Financial Resource Strain:   . Difficulty of Paying Living Expenses:   Food Insecurity:   . Worried About Charity fundraiser in the Last Year:   . Arboriculturist in the Last Year:   Transportation Needs:   . Film/video editor (Medical):   Marland Kitchen Lack of Transportation (Non-Medical):   Physical Activity:   . Days of Exercise  per Week:   . Minutes of Exercise per Session:   Stress:   . Feeling of Stress :   Social Connections:   . Frequency of Communication with Friends and Family:   . Frequency of Social Gatherings with Friends and Family:   . Attends Religious Services:   . Active Member of Clubs or Organizations:   . Attends Archivist Meetings:   Marland Kitchen Marital Status:   Intimate Partner Violence:   . Fear of Current or Ex-Partner:   . Emotionally Abused:   Marland Kitchen Physically Abused:   . Sexually Abused:     Allergies:  Allergies  Allergen Reactions  . Ciprofloxacin Swelling  . Doxycycline Nausea Only and Other (See Comments)    GI Upset  . Gelatin     Alpha gal  . Meat Extract     Alpha-gal - all mammal products  . Milk-Related Compounds Hives and Swelling    Alpha gal  . Sulfa Antibiotics Hives    Medications: Prior to Admission medications   Medication Sig Start Date End Date Taking? Authorizing Provider  azelastine (ASTELIN) 0.1 % nasal spray Place into the nose. 03/07/19  Yes [provider]  clindamycin (CLEOCIN) 300 MG capsule 300mg  twice daily 01/29/18  Yes [provider]  Clobetasol Propionate Emulsion 0.05 % topical foam APPLY TO THE AFFECTED AREA ONCE TO TWICE DAILY AS NEEDED FOR ITCH. AVOID FACE, GROIN, AND AXILLA 10/07/16  Yes [provider]  clotrimazole-betamethasone (LOTRISONE) cream APPLY TO AFFECTED AREA TWICE A DAY 08/19/19  Yes Felipa Furnace, DPM  diphenhydrAMINE (BENADRYL) 25 mg capsule Take 25 mg by mouth.   Yes [provider]  EPINEPHrine 0.3 mg/0.3 mL IJ SOAJ injection Inject into the muscle. 01/06/16  Yes [provider]  famotidine (PEPCID) 40 MG tablet Take by mouth.   Yes [provider]  Hydroxychloroquine Sulfate (PLAQUENIL PO) Take by mouth.   Yes [provider]  ibuprofen (ADVIL) 600 MG tablet As needed 03/28/13  Yes [provider]  metoprolol tartrate (LOPRESSOR) 25 MG tablet Take by  mouth. 11/01/10  Yes [provider]    Physical Exam Vitals: Blood pressure 130/86, pulse 81, height 5' 7.5" (1.715 m), weight 221 lb (100.2 kg).  General: NAD HEENT: normocephalic, anicteric Thyroid: no enlargement, no palpable nodules Pulmonary: No increased work of breathing, CTAB Cardiovascular: RRR, distal pulses 2+ Breast: Breast symmetrical, no tenderness, no palpable nodules or masses, no skin or nipple retraction present, no nipple discharge.  No axillary or supraclavicular lymphadenopathy. Abdomen: NABS, soft, non-tender, non-distended.  Umbilicus without lesions.  No hepatomegaly, splenomegaly or masses palpable. No evidence of hernia  Genitourinary:  External: Normal external female genitalia.  Normal urethral meatus, normal Bartholin's and Skene's glands.    Vagina: Normal vaginal mucosa, no evidence of prolapse.    Cervix: Grossly normal in appearance, no bleeding  Uterus: Non-enlarged, mobile, normal  contour.  No CMT  Adnexa: ovaries non-enlarged, no adnexal masses  Rectal: deferred  Lymphatic: no evidence of inguinal lymphadenopathy Extremities: no edema, erythema, or tenderness Neurologic: Grossly intact Psychiatric: mood appropriate, affect full  Female chaperone present for pelvic and breast  portions of the physical exam     Assessment: 54 y.o. [id number] routine annual exam  Plan: Problem List Items Addressed This Visit    None    Visit Diagnoses    Encounter for gynecological examination without abnormal finding    -  Primary   Screening for malignant neoplasm of cervix       Relevant Orders   Cytology - PAP   Breast screening       Relevant Orders   MM 3D SCREEN BREAST BILATERAL      1) Mammogram - recommend yearly screening mammogram.  Mammogram Was ordered today  2) STI screening  was notoffered and therefore not obtained  3) ASCCP guidelines and rational discussed.  Patient opts for yearly screening interval  4) Osteoporosis  - per  USPTF routine screening DEXA at age 7  5) Routine healthcare maintenance including cholesterol, diabetes screening discussed managed by PCP  6) Colposcopy 11/04/2017 diagnostic colonoscopy following positive cologuard with mild chronic colitis  7) Return in about 1 year (around 08/26/2020) for annual.    Vena Austria, MD Domingo Pulse, [facility] 08/27/2019, 9:07 AM

## 2019-08-30 LAB — IGP, APTIMA HPV, RFX 16/18,45: HPV Aptima: NEGATIVE

## 2019-09-18 ENCOUNTER — Other Ambulatory Visit: Payer: Self-pay

## 2019-09-18 ENCOUNTER — Emergency Department
Admission: EM | Admit: 2019-09-18 | Discharge: 2019-09-18 | Disposition: A | Discharge status: Home / Self Care | Payer: Managed Care, Other (non HMO) | Attending: Emergency Medicine | Admitting: Emergency Medicine

## 2019-09-18 ENCOUNTER — Emergency Department: Payer: Managed Care, Other (non HMO)

## 2019-09-18 DIAGNOSIS — R197 Diarrhea, unspecified: Secondary | ICD-10-CM | POA: Diagnosis not present

## 2019-09-18 DIAGNOSIS — I10 Essential (primary) hypertension: Secondary | ICD-10-CM | POA: Diagnosis not present

## 2019-09-18 DIAGNOSIS — Z79899 Other long term (current) drug therapy: Secondary | ICD-10-CM | POA: Insufficient documentation

## 2019-09-18 DIAGNOSIS — R7303 Prediabetes: Secondary | ICD-10-CM | POA: Insufficient documentation

## 2019-09-18 DIAGNOSIS — R0602 Shortness of breath: Secondary | ICD-10-CM | POA: Diagnosis present

## 2019-09-18 LAB — COMPREHENSIVE METABOLIC PANEL
ALT: 19 U/L (ref 0–44)
AST: 21 U/L (ref 15–41)
Albumin: 3.5 g/dL (ref 3.5–5.0)
Alkaline Phosphatase: 75 U/L (ref 38–126)
Anion gap: 7 (ref 5–15)
BUN: 12 mg/dL (ref 6–20)
CO2: 24 mmol/L (ref 22–32)
Calcium: 8.8 mg/dL — ABNORMAL LOW (ref 8.9–10.3)
Chloride: 109 mmol/L (ref 98–111)
Creatinine, Ser: 0.93 mg/dL (ref 0.44–1.00)
GFR calc Af Amer: >60 mL/min (ref >60)
GFR calc non Af Amer: >60 mL/min (ref >60)
Glucose, Bld: 88 mg/dL (ref 70–99)
Potassium: 3.6 mmol/L (ref 3.5–5.1)
Sodium: 140 mmol/L (ref 135–145)
Total Bilirubin: 0.7 mg/dL (ref 0.3–1.2)
Total Protein: 7.7 g/dL (ref 6.5–8.1)

## 2019-09-18 LAB — CBC
HCT: 37.2 % (ref 36.0–46.0)
Hemoglobin: 12.6 g/dL (ref 12.0–15.0)
MCH: 28.2 pg (ref 26.0–34.0)
MCHC: 33.9 g/dL (ref 30.0–36.0)
MCV: 83.2 fL (ref 80.0–100.0)
Platelets: 214 10*3/uL (ref 150–400)
RBC: 4.47 MIL/uL (ref 3.87–5.11)
RDW: 13.7 % (ref 11.5–15.5)
WBC: 7.8 10*3/uL (ref 4.0–10.5)
nRBC: 0 % (ref 0.0–0.2)

## 2019-09-18 LAB — LIPASE, BLOOD: Lipase: 46 U/L (ref 11–51)

## 2019-09-18 MED ORDER — SODIUM CHLORIDE 0.9% FLUSH: 3.0000 mL | Freq: Once | INTRAVENOUS | Status: DC

## 2019-09-18 MED ORDER — DIPHENOXYLATE-ATROPINE 2.5-0.025 MG PO TABS
1.0000 | ORAL_TABLET | Freq: Four times a day (QID) | ORAL | 1 refills | Status: AC | PRN
Start: 2019-09-18 — End: 2020-09-17

## 2019-09-18 NOTE — ED Triage Notes (Signed)
Pt to the er for SOB. Pt had PNE in Feb. Pt went to [facility] 3 weeks ago and a nodule was noted. Pt saw [facility] and was given antibiotics (lomotil and omnicef) and the symptoms went away until she completed the meds. Pt reports wheezing. Pt has a RX for inhaler but she thinks its broken. No acute distress.

## 2019-09-18 NOTE — ED Provider Notes (Signed)
[facility] Emergency Department Provider Note  ____________________________________________   First MD Initiated Contact with Patient 09/18/19 2256     (approximate)  I have reviewed the triage vital signs and the nursing notes.   HISTORY  Chief Complaint Shortness of Breath    HPI Jacqueline [name] is a 54 y.o. female with medical history as listed below which notably includes pneumonia over the last few months and extensive work-up by pulmonology and cardiology at [facility].  She presents tonight for evaluation of 3 weeks of diarrhea.  She is not sure when exactly or why it started but it may have been after a round of antibiotics for her pneumonia.  She said that she has an average of 6 loose and watery stools per day and it is affecting her ability to work and do her activities of daily living.  She has had no nausea or vomiting and no abdominal pain.  She has some baseline shortness of breath with exertion but it is no different than the symptoms for which she has been going to her pulmonologist and other doctors at [facility].  She believes that she has a wheezing earlier today but that has resolved.  Diarrhea does not seem to be affected by anything she eats or drinks.  She has not taken any probiotics.  She said that she went to a walk-in clinic within the last couple of weeks and they prescribed cefdinir and Lomotil.  She said that her symptoms improved while she was on the medicines but as soon as she stopped taking them then the diarrhea came back.  She has an appointment in the morning with her primary care doctor but felt that she could not wait until the morning because the diarrhea has been bothering her for so many weeks.  She also tried scheduling an appointment with gastroenterology, Dr. Servando Snare, and they said there is a wait list and gave her an appointment in September.  She describes the symptoms as severe, gradual in onset, and nothing in particular makes them better or  worse.        Past Medical History:  Diagnosis Date  . Allergy   . Cardiomegaly   . DVT (deep venous thrombosis) (HCC)    history of - Right arm  . 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 GERD (gastroesophageal reflux disease)   . Hypertension   . Leaky heart valve   . Lupus (HCC)   . Motion sickness    ocean ships  . Orthodontics    wears braces  . Pre-diabetes   . Sleep apnea    CPAP  . Urethral stricture   . Wears contact lenses     Patient Active Problem List   Diagnosis Date Noted  . Hypertension, essential 09/20/2017  . Positive colorectal cancer screening using Cologuard test   . Noninfectious gastroenteritis   . History of Lyme disease 12/09/2015  . Psoriasis 11/20/2015  . Recurrent UTI (urinary tract infection) 11/20/2015  . Systemic lupus (HCC) 11/20/2015  . History of shingles 04/24/2015  . Post herpetic neuralgia 04/24/2015  . Food allergy 12/11/2014  . Mild obesity 10/03/2014  . Lymphadenopathy 09/19/2013  . Systemic lupus erythematosus (HCC) 06/13/2012  . 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 CESAREAN SECTION    . COLONOSCOPY WITH PROPOFOL N/A 11/04/2016   Procedure: COLONOSCOPY WITH PROPOFOL;  Surgeon: Midge Minium,  MD;  Location: [facility];  Service: Gastroenterology;  Laterality: N/A;  . CYST EXCISION     biopsy - neck  . VULVA /PERINEUM BIOPSY      Prior to Admission medications   Medication Sig Start Date End Date Taking? Authorizing Provider  azelastine (ASTELIN) 0.1 % nasal spray Place into the nose. 03/07/19   [provider]  clindamycin (CLEOCIN) 300 MG capsule 300mg  twice daily 01/29/18   [provider]  Clobetasol Propionate Emulsion 0.05 % topical foam APPLY TO THE AFFECTED AREA ONCE TO TWICE DAILY AS NEEDED FOR ITCH. AVOID FACE, GROIN, AND AXILLA 10/07/16   [provider]    clotrimazole-betamethasone (LOTRISONE) cream APPLY TO AFFECTED AREA TWICE A DAY 08/19/19   08/21/19, DPM  diphenhydrAMINE (BENADRYL) 25 mg capsule Take 25 mg by mouth.    [provider]  diphenoxylate-atropine (LOMOTIL) 2.5-0.025 MG tablet Take 1 tablet by mouth 4 (four) times daily as needed for diarrhea or loose stools. 09/18/19 09/17/20  09/19/20, MD  EPINEPHrine 0.3 mg/0.3 mL IJ SOAJ injection Inject into the muscle. 01/06/16   [provider]  famotidine (PEPCID) 40 MG tablet Take by mouth.    [provider]  Hydroxychloroquine Sulfate (PLAQUENIL PO) Take by mouth.    [provider]  ibuprofen (ADVIL) 600 MG tablet As needed 03/28/13   [provider]  metoprolol tartrate (LOPRESSOR) 25 MG tablet Take by mouth. 11/01/10   [provider]    Allergies Ciprofloxacin, Doxycycline, Gelatin, Meat extract, Milk-related compounds, and Sulfa antibiotics  Family History  Problem Relation Age of Onset  . Kidney disease Mother        only one kidney from accident at childhood  . Lung cancer Father 41  . Bladder Cancer Neg Hx   . Breast cancer Neg Hx     Social History Social History   Tobacco Use  . Smoking status: Never Smoker  . Smokeless tobacco: Never Used  Vaping Use  . Vaping Use: Never used  Substance Use Topics  . Alcohol use: No  . Drug use: No    Review of Systems Constitutional: No fever/chills Eyes: No visual changes. ENT: No sore throat. Cardiovascular: Denies chest pain. Respiratory: Mild shortness of breath at baseline with occasional wheezing. Gastrointestinal: Persistent watery diarrhea for 3+ weeks.  No consistent abdominal pain.  No nausea nor vomiting. Genitourinary: Negative for dysuria. Musculoskeletal: Negative for neck pain.  Negative for back pain. Integumentary: Negative for rash. Neurological: Negative for headaches, focal weakness or  numbness.   ____________________________________________   PHYSICAL EXAM:  VITAL SIGNS: ED Triage Vitals  Enc Vitals Group     BP 09/18/19 1911 (!) 175/75     Pulse Rate 09/18/19 1911 86     Resp 09/18/19 1911 18     Temp 09/18/19 1911 98 F (36.7 C)     Temp Source 09/18/19 1911 Oral     SpO2 09/18/19 1911 96 %     Weight 09/18/19 1911 102.1 kg (225 lb)     Height 09/18/19 1911 1.715 m (5' 7.5")     Head Circumference --      Peak Flow --      Pain Score 09/18/19 1920 0     Pain Loc --      Pain Edu? --      Excl. in GC? --     Constitutional: Alert and oriented.  Eyes: Conjunctivae are normal.  Head: Atraumatic. Nose: No congestion/rhinnorhea. Mouth/Throat:  Patient is wearing a mask. Neck: No stridor.  No meningeal signs.   Cardiovascular: Normal rate, regular rhythm. Good peripheral circulation. Grossly normal heart sounds. Respiratory: Normal respiratory effort.  No retractions. Gastrointestinal: Soft and nontender. No distention.  Musculoskeletal: No lower extremity tenderness nor edema. No gross deformities of extremities. Neurologic:  Normal speech and language. No gross focal neurologic deficits are appreciated.  Skin:  Skin is warm, dry and intact. Psychiatric: Mood and affect are normal. Speech and behavior are normal.  ____________________________________________   LABS (all labs ordered are listed, but only abnormal results are displayed)  Labs Reviewed  COMPREHENSIVE METABOLIC PANEL - Abnormal; Notable for the following components:      Result Value   Calcium 8.8 (*)    All other components within normal limits  LIPASE, BLOOD  CBC  URINALYSIS, COMPLETE (UACMP) WITH MICROSCOPIC   ____________________________________________  EKG  ED ECG REPORT I, Loleta Rose, the attending physician, personally viewed and interpreted this ECG.  Date: 09/18/2019 EKG Time: 19: 12 Rate: 77 Rhythm: normal sinus rhythm QRS Axis: normal Intervals:  normal ST/T Wave abnormalities: normal Narrative Interpretation: no evidence of acute ischemia  ____________________________________________  RADIOLOGY I, Loleta Rose, personally viewed and evaluated these images (plain radiographs) as part of my medical decision making, as well as reviewing the written report by the radiologist.  ED MD interpretation: No acute abnormalities identified on chest x-ray.  Some chronic bronchitic changes.  Official radiology report(s): DG Chest 2 View  Result Date: 09/18/2019 CLINICAL DATA:  54 year old female with shortness of breath. EXAM: CHEST - 2 VIEW COMPARISON:  Chest radiograph dated 04/05/2019. FINDINGS: There is mild interstitial prominence and chronic bronchitic changes with mild diffuse hazy pulmonary density. Findings may be chronic. However, an atypical or viral infection, although less likely, is not excluded. Clinical correlation is recommended. No focal consolidation, pleural effusion, or pneumothorax. The cardiac silhouette is within normal limits. No acute osseous pathology. IMPRESSION: 1. No focal consolidation. 2. Mild interstitial prominence and chronic bronchitic changes. Electronically Signed   By: Elgie Collard M.D.   On: 09/18/2019 19:42    ____________________________________________   PROCEDURES   Procedure(s) performed (including Critical Care):  Procedures   ____________________________________________   INITIAL IMPRESSION / MDM / ASSESSMENT AND PLAN / ED COURSE  As part of my medical decision making, I reviewed the following data within the electronic MEDICAL RECORD NUMBER Nursing notes reviewed and incorporated, Labs reviewed , EKG interpreted , Old chart reviewed, Notes from prior ED visits and [place] Controlled Substance Database   Differential diagnosis includes, but is not limited to, chronic diarrhea from IBS, medication or drug side effect, bowel flora disruption, C. difficile infection, anyone of a number of viral or  bacterial GI infections, electrolyte or metabolic abnormality.  The patient's vital signs are stable.  She is well-appearing and in no distress.  EKG is normal, labs are all within normal limits including her electrolytes and no leukocytosis or leukopenia.  She has been tested 3 times in the past for COVID-19 and is all been negative and she is fully vaccinated.  She has no abdominal tenderness to palpation and no distention and is having no nausea nor vomiting nor pain to suggest an SBO or ileus.  I explained that I could do stool studies tonight including C. difficile and GI pathogen panel if she felt that she was able to provide a specimen, but otherwise her best bet is for me to give her another prescription for Lomotil, which seem  to work for her, and follow-up with her primary care doctor in the morning as scheduled as well as with her GI appointment.  She said that she would rather go home and follow-up in the morning which I think is appropriate given the duration of symptoms.  I encouraged her to eat a regular diet and read through the information provided about diarrhea and food choices that may help and I encouraged her to follow-up as scheduled.  I gave my usual and customary return precautions.           ____________________________________________  FINAL CLINICAL IMPRESSION(S) / ED DIAGNOSES  Final diagnoses:  Diarrhea, unspecified type     MEDICATIONS GIVEN DURING THIS VISIT:  Medications  sodium chloride flush (NS) 0.9 % injection 3 mL (3 mLs Intravenous Not Given 09/18/19 2234)     ED Discharge Orders         Ordered    diphenoxylate-atropine (LOMOTIL) 2.5-0.025 MG tablet  4 times daily PRN     Discontinue  Reprint     09/18/19 2330          *Please note:  Jacqueline [name] was evaluated in Emergency Department on 09/18/2019 for the symptoms described in the history of present illness. She was evaluated in the context of the global COVID-19 pandemic, which necessitated  consideration that the patient might be at risk for infection with the SARS-CoV-2 virus that causes COVID-19. Institutional protocols and algorithms that pertain to the evaluation of patients at risk for COVID-19 are in a state of rapid change based on information released by regulatory bodies including the CDC and federal and state organizations. These policies and algorithms were followed during the patient's care in the ED.  Some ED evaluations and interventions may be delayed as a result of limited staffing during and after the pandemic.*  Note:  This document was prepared using Dragon voice recognition software and may include unintentional dictation errors.   Loleta RoseForbach, [place] Frieden, MD 09/18/19 [id number]

## 2019-09-18 NOTE — Discharge Instructions (Signed)
Your labs and physical exam were reassuring tonight.  We offered to do stool studies tonight, but you prefer to follow up with your primary care doctor in the morning, which is appropriate.  He can order for you the appropriate outpatient stool tests.  Please read through the included information and follow up with your doctors as scheduled.  Return to the Emergency Department if you develop new or worsening symptoms that concern you.

## 2019-11-12 ENCOUNTER — Ambulatory Visit (INDEPENDENT_AMBULATORY_CARE_PROVIDER_SITE_OTHER): Payer: Managed Care, Other (non HMO) | Admitting: Podiatry

## 2019-11-12 ENCOUNTER — Other Ambulatory Visit: Payer: Self-pay

## 2019-11-12 DIAGNOSIS — R234 Changes in skin texture: Secondary | ICD-10-CM | POA: Diagnosis not present

## 2019-11-12 DIAGNOSIS — L0889 Other specified local infections of the skin and subcutaneous tissue: Secondary | ICD-10-CM | POA: Diagnosis not present

## 2019-11-12 DIAGNOSIS — L853 Xerosis cutis: Secondary | ICD-10-CM

## 2019-11-12 DIAGNOSIS — B353 Tinea pedis: Secondary | ICD-10-CM

## 2019-11-12 MED ORDER — BETAMETHASONE VALERATE 0.1 % EX OINT
1.0000 | TOPICAL_OINTMENT | Freq: Two times a day (BID) | CUTANEOUS | 2 refills | Status: DC
Start: 2019-11-12 — End: 2020-09-25

## 2019-11-12 MED ORDER — TERBINAFINE HCL 250 MG PO TABS: 250.0000 mg | ORAL_TABLET | Freq: Every day | ORAL | 0 refills | Status: AC

## 2019-11-12 MED ORDER — MUPIROCIN 2 % EX OINT
1.0000 | TOPICAL_OINTMENT | Freq: Two times a day (BID) | CUTANEOUS | 2 refills | Status: DC
Start: 2019-11-12 — End: 2020-09-25

## 2019-11-12 NOTE — Progress Notes (Signed)
   HPI: 54 y.o. female presenting today for follow-up evaluation regarding hyperkeratosis and peeling of the bilateral weightbearing surfaces of the feet.  Patient has been dealing with this for several years now.  She was last seen by me in the office on 11/18/2016.  At that time she did notice some relief with oral antifungal medication and topical Lotrisone cream.  She came back to the office on 06/10/2019 and was seen by Dr. Allena Katz.  At that time additional Lotrisone cream was provided and she is only noticed minimal relief.  She presents for further treatment and evaluation  Past Medical History:  Diagnosis Date  . Allergy   . Cardiomegaly   . DVT (deep venous thrombosis) (HCC)    history of - Right arm  . 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 GERD (gastroesophageal reflux disease)   . Hypertension   . Leaky heart valve   . Lupus (HCC)   . Motion sickness    ocean ships  . Orthodontics    wears braces  . Pre-diabetes   . Sleep apnea    CPAP  . Urethral stricture   . Wears contact lenses      Physical Exam: General: The patient is alert and oriented x3 in no acute distress.  Dermatology: Skin is warm, dry and supple bilateral lower extremities. Negative for open lesions or macerations.  Diffuse hyperkeratosis with peeling of the weightbearing surface of the bilateral feet noted.  There does appear to be some evidence of a pitted keratolysis throughout the foot, although there is no hyperhidrosis noted.  No malodor noted.  Vascular: Palpable pedal pulses bilaterally. No edema or erythema noted. Capillary refill within normal limits.  Neurological: Epicritic and protective threshold grossly intact bilaterally.   Musculoskeletal Exam: Range of motion within normal limits to all pedal and ankle joints bilateral. Muscle strength 5/5 in all groups bilateral.   Assessment: 1.  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 Patient  evaluated.  2.  Today we will attempt to treat the patient with both antifungal properties and antibacterial properties to address both tinea pedis and pitted keratolysis 3.  Prescription for Lamisil turned 50 mg #30 no refills take daily. 4.  Prescription for mupirocin ointment 5.  Prescription for betamethasone 0.1% ointment apply 2 times daily when mixed in equal portions with the mupirocin ointment 6.  Return to clinic in 6 weeks      Felecia Shelling, DPM [facility]  Dr. Felecia Shelling, DPM    [address] [place], [place] [address]                Office [phone]  Fax [phone]

## 2019-11-13 ENCOUNTER — Other Ambulatory Visit: Payer: Self-pay

## 2019-11-13 ENCOUNTER — Ambulatory Visit (INDEPENDENT_AMBULATORY_CARE_PROVIDER_SITE_OTHER): Payer: Managed Care, Other (non HMO) | Admitting: Gastroenterology

## 2019-11-13 ENCOUNTER — Encounter: Payer: Self-pay | Admitting: Gastroenterology

## 2019-11-13 VITALS — BP 161/79 | HR 71 | Temp 97.7°F | Ht 67.5 in | Wt 229.0 lb

## 2019-11-13 DIAGNOSIS — R197 Diarrhea, unspecified: Secondary | ICD-10-CM

## 2019-11-13 NOTE — Progress Notes (Signed)
Gastroenterology Consultation  Referring Provider:     [facility] Primary Care Physician:  [facility] Primary Gastroenterologist:  Dr. Servando Snare     Reason for Consultation:     Diarrhea        HPI:   Jacqueline [name] is a 54 y.o. y/o female referred for consultation & management of diarrhea by Dr. [facility]  This patient comes in after having a colonoscopy by me back in 2018.  At that time the patient had a positive Cologuard test and the colonoscopy showed some erythema in the sigmoid colon.  The patient had biopsies of this area that showed nonspecific colitis.  The patient was not having any symptoms at time.  The patient then was in the emergency room back in July of this year and had reported that she was having 3 weeks of diarrhea.  Prior to that the patient had been at [facility] with a work-up for lung issues and believes she was started on antibiotics at that time also.  The patient was discharged from the ER at that time and now follows up with me.  The patient reports that approximately 1 week after being in the hospital she started to feel better.  She says that the diarrhea was explosive at that time but now her stools have not come back to normal but are not better.  She was given Lomotil that she takes as needed but has not had much occasion to use them.  She does report that she has ice cream on a regular basis but does not drink milk.  She has a daughter who is lactose intolerant.  There is no report of any unexplained weight loss fevers chills nausea or vomiting.   Past Medical History:  Diagnosis Date  . Allergy   . Cardiomegaly   . DVT (deep venous thrombosis) (HCC)    history of - Right arm  . 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 GERD (gastroesophageal reflux disease)   . Hypertension   . Leaky heart valve   . Lupus (HCC)   . Motion sickness    ocean ships  . Orthodontics    wears braces  . Pre-diabetes   . Sleep apnea     CPAP  . Urethral stricture   . Wears contact lenses     Past Surgical History:  Procedure Laterality Date  . CESAREAN SECTION    . COLONOSCOPY WITH PROPOFOL N/A 11/04/2016   Procedure: COLONOSCOPY WITH PROPOFOL;  Surgeon: Midge Minium, MD;  Location: [facility];  Service: Gastroenterology;  Laterality: N/A;  . CYST EXCISION     biopsy - neck  . VULVA /PERINEUM BIOPSY      Prior to Admission medications   Medication Sig Start Date End Date Taking? Authorizing Provider  azelastine (ASTELIN) 0.1 % nasal spray Place into the nose. 03/07/19   [provider]  betamethasone valerate ointment (VALISONE) 0.1 % Apply 1 application topically 2 (two) times daily. 11/12/19   Felecia Shelling, DPM  clindamycin (CLEOCIN) 300 MG capsule 300mg  twice daily 01/29/18   [provider]  diphenhydrAMINE (BENADRYL) 25 mg capsule Take 25 mg by mouth.    [provider]  diphenoxylate-atropine (LOMOTIL) 2.5-0.025 MG tablet Take 1 tablet by mouth 4 (four) times daily as needed for diarrhea or loose stools. 09/18/19 09/17/20  09/19/20, MD  EPINEPHrine 0.3 mg/0.3 mL IJ SOAJ injection Inject into the muscle. 01/06/16  [provider]  famotidine (PEPCID) 40 MG tablet Take by mouth.    [provider]  Hydroxychloroquine Sulfate (PLAQUENIL PO) Take by mouth.    [provider]  ibuprofen (ADVIL) 600 MG tablet As needed 03/28/13   [provider]  metoprolol tartrate (LOPRESSOR) 25 MG tablet Take by mouth. 11/01/10   [provider]  mupirocin ointment (BACTROBAN) 2 % Apply 1 application topically 2 (two) times daily. 11/12/19   Felecia Shelling, DPM  terbinafine (LAMISIL) 250 MG tablet Take 1 tablet (250 mg total) by mouth daily. 11/12/19   Felecia Shelling, DPM    Family History  Problem Relation Age of Onset  . Kidney disease Mother        only one kidney from accident at childhood  . Lung cancer Father 77  . Bladder Cancer Neg Hx   .  Breast cancer Neg Hx      Social History   Tobacco Use  . Smoking status: Never Smoker  . Smokeless tobacco: Never Used  Vaping Use  . Vaping Use: Never used  Substance Use Topics  . Alcohol use: No  . Drug use: No    Allergies as of 11/13/2019 - Review Complete 11/12/2019  Allergen Reaction Noted  . Ciprofloxacin Swelling 02/13/2015  . Doxycycline Nausea Only and Other (See Comments) 02/13/2015  . Gelatin  06/24/2014  . Meat extract  10/06/2016  . Milk-related compounds Hives and Swelling 06/24/2014  . Sulfa antibiotics Hives 07/12/2013    Review of Systems:    All systems reviewed and negative except where noted in HPI.   Physical Exam:  There were no vitals taken for this visit. No LMP recorded. Patient is postmenopausal. General:   Alert,  Well-developed, well-nourished, pleasant and cooperative in NAD Head:  Normocephalic and atraumatic. Eyes:  Sclera clear, no icterus.   Conjunctiva pink. Ears:  Normal auditory acuity. Neck:  Supple; no masses or thyromegaly. Lungs:  Respirations even and unlabored.  Clear throughout to auscultation.   No wheezes, crackles, or rhonchi. No acute distress. Heart:  Regular rate and rhythm; no murmurs, clicks, rubs, or gallops. Abdomen:  Normal bowel sounds.  No bruits.  Soft, non-tender and non-distended without masses, hepatosplenomegaly or hernias noted.  No guarding or rebound tenderness.  Negative Carnett sign.   Rectal:  Deferred.  Pulses:  Normal pulses noted. Extremities:  No clubbing or edema.  No cyanosis. Neurologic:  Alert and oriented x3;  grossly normal neurologically. Skin:  Intact without significant lesions or rashes.  No jaundice. Lymph Nodes:  No significant cervical adenopathy. Psych:  Alert and cooperative. Normal mood and affect.  Imaging Studies: No results found.  Assessment and Plan:   Jacqueline [name] is a 54 y.o. y/o female who comes in with an episode of a few weeks of diarrhea back in July.  The  patient's stools have not come back to normal but are much better than they were in July.  She is not having explosive diarrhea anymore.  The patient has been told to try to avoid milk products for 1 week and see if that helps her symptoms.  If it does not she has been told to try a probiotic and see if that brings her microbio back into alignment thereby bring her stools to a firmer consistency.  This may have been all precipitated by her antibiotic use.  If the probiotics do not work and the milk avoidance does not work then the patient will contact my  office and be set up for repeat colonoscopy with biopsies to see if there is any progression of the nonspecific inflammation she had in 2018.  The patient has been explained the plan and agrees with it.    Midge Minium, MD. Clementeen Graham    Note: This dictation was prepared with Dragon dictation along with smaller phrase technology. Any transcriptional errors that result from this process are unintentional.

## 2019-11-19 ENCOUNTER — Ambulatory Visit: Payer: Managed Care, Other (non HMO) | Admitting: Gastroenterology

## 2019-11-29 ENCOUNTER — Telehealth: Payer: Self-pay | Admitting: Gastroenterology

## 2019-11-29 NOTE — Telephone Encounter (Signed)
Patient seen on 9.8.21 states Dr. Servando Snare wanted her to try a dairy free diet in hopes of clearing the diarrhea she was experiencing. Pt states she has been dairy free, but still experiencing diarrhea and food particles in her stool. Pt requesting advice as to what to try next.

## 2019-12-27 ENCOUNTER — Ambulatory Visit (INDEPENDENT_AMBULATORY_CARE_PROVIDER_SITE_OTHER): Payer: Managed Care, Other (non HMO) | Admitting: Podiatry

## 2019-12-27 ENCOUNTER — Other Ambulatory Visit: Payer: Self-pay

## 2019-12-27 DIAGNOSIS — L0889 Other specified local infections of the skin and subcutaneous tissue: Secondary | ICD-10-CM | POA: Diagnosis not present

## 2019-12-27 DIAGNOSIS — B353 Tinea pedis: Secondary | ICD-10-CM | POA: Diagnosis not present

## 2019-12-27 DIAGNOSIS — M79674 Pain in right toe(s): Secondary | ICD-10-CM | POA: Diagnosis not present

## 2019-12-27 DIAGNOSIS — R234 Changes in skin texture: Secondary | ICD-10-CM | POA: Diagnosis not present

## 2019-12-27 DIAGNOSIS — B351 Tinea unguium: Secondary | ICD-10-CM

## 2019-12-27 DIAGNOSIS — L853 Xerosis cutis: Secondary | ICD-10-CM

## 2019-12-27 DIAGNOSIS — M79675 Pain in left toe(s): Secondary | ICD-10-CM | POA: Diagnosis not present

## 2019-12-30 NOTE — Progress Notes (Signed)
   HPI: 54 y.o. female presenting today for follow-up evaluation regarding hyperkeratosis and peeling of the bilateral weightbearing surfaces of the feet.  Patient has been dealing with this for several years now.  Patient states that she is not 100% today but she is much better than before.  Patient has been applying the mupirocin ointment with betamethasone ointment 2 times daily with significant improvement.  She also has been taking oral Lamisil as prescribed.  The patient does have a new complaint regarding her toenail.  She states that she is having some tenderness to the toenails and she would like to have them evaluated.  She is unable to trim her own nails.  They have become thick and elongated and very symptomatic with shoes.  Past Medical History:  Diagnosis Date  . Allergy   . Cardiomegaly   . DVT (deep venous thrombosis) (HCC)    history of - Right arm  . 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 GERD (gastroesophageal reflux disease)   . Hypertension   . Leaky heart valve   . Lupus (HCC)   . Motion sickness    ocean ships  . Orthodontics    wears braces  . Pre-diabetes   . Sleep apnea    CPAP  . Urethral stricture   . Wears contact lenses      Physical Exam: General: The patient is alert and oriented x3 in no acute distress.  Dermatology: Skin is warm, dry and supple bilateral lower extremities. Negative for open lesions or macerations.  Diffuse hyperkeratosis with peeling of the weightbearing surface of the bilateral feet noted however this appears significantly improved since last visit.  There does appear to be some evidence of a pitted keratolysis throughout the foot, although there is no hyperhidrosis noted, again, this is significantly improved since last visit.  No malodor noted.  Hyperkeratotic dystrophic nails noted 1-5 bilateral with sensitivity with palpation  Vascular: Palpable pedal pulses bilaterally. No edema or erythema noted.  Capillary refill within normal limits.  Neurological: Epicritic and protective threshold grossly intact bilaterally.   Musculoskeletal Exam: Range of motion within normal limits to all pedal and ankle joints bilateral. Muscle strength 5/5 in all groups bilateral.   Assessment: 1.  Tinea pedis versus pitted keratolysis both feet 2.  Pain due to onychomycosis of toenails both   Plan of Care:  1. Patient evaluated.  2.  Continue betamethasone and mupirocin ointment 2 times daily as needed 3.  Recommend daily foot lotion 4.  Mechanical debridement of nails 1-5 bilateral was performed using a nail nipper without incident or bleeding 5.  Return to clinic as needed     Felecia Shelling, DPM [facility]  Dr. Felecia Shelling, DPM    [address] [place], [place] [address]                Office [phone]  Fax [phone]

## 2020-02-10 ENCOUNTER — Ambulatory Visit
Admission: RE | Admit: 2020-02-10 | Discharge: 2020-02-10 | Disposition: A | Discharge status: Home / Self Care | Payer: Managed Care, Other (non HMO) | Source: Ambulatory Visit | Attending: Obstetrics and Gynecology | Admitting: Obstetrics and Gynecology

## 2020-02-10 ENCOUNTER — Other Ambulatory Visit: Payer: Self-pay

## 2020-02-10 DIAGNOSIS — Z1231 Encounter for screening mammogram for malignant neoplasm of breast: Secondary | ICD-10-CM | POA: Insufficient documentation

## 2020-02-10 DIAGNOSIS — Z1239 Encounter for other screening for malignant neoplasm of breast: Secondary | ICD-10-CM

## 2020-02-24 ENCOUNTER — Other Ambulatory Visit: Payer: Self-pay | Admitting: Gastroenterology

## 2020-02-24 DIAGNOSIS — R195 Other fecal abnormalities: Secondary | ICD-10-CM

## 2020-03-11 ENCOUNTER — Other Ambulatory Visit: Payer: Self-pay

## 2020-03-11 ENCOUNTER — Ambulatory Visit
Admission: RE | Admit: 2020-03-11 | Discharge: 2020-03-11 | Disposition: A | Discharge status: Home / Self Care | Payer: 59 | Source: Ambulatory Visit | Attending: Gastroenterology | Admitting: Gastroenterology

## 2020-03-11 DIAGNOSIS — R195 Other fecal abnormalities: Secondary | ICD-10-CM | POA: Diagnosis present

## 2020-03-11 MED ORDER — IOHEXOL 300 MG/ML  SOLN
100.0000 mL | Freq: Once | INTRAMUSCULAR | Status: AC | PRN
  Administered 2020-03-11: 100 mL via INTRAVENOUS

## 2020-09-02 ENCOUNTER — Ambulatory Visit: Payer: Managed Care, Other (non HMO) | Admitting: Obstetrics and Gynecology

## 2020-09-04 ENCOUNTER — Ambulatory Visit: Payer: Managed Care, Other (non HMO) | Admitting: Obstetrics and Gynecology

## 2020-09-25 ENCOUNTER — Encounter: Payer: Self-pay | Admitting: Obstetrics and Gynecology

## 2020-09-25 ENCOUNTER — Other Ambulatory Visit: Payer: Self-pay

## 2020-09-25 ENCOUNTER — Ambulatory Visit (INDEPENDENT_AMBULATORY_CARE_PROVIDER_SITE_OTHER): Payer: 59 | Admitting: Obstetrics and Gynecology

## 2020-09-25 VITALS — BP 138/86 | HR 79 | Ht 67.5 in | Wt 242.0 lb

## 2020-09-25 DIAGNOSIS — Z124 Encounter for screening for malignant neoplasm of cervix: Secondary | ICD-10-CM

## 2020-09-25 DIAGNOSIS — Z1239 Encounter for other screening for malignant neoplasm of breast: Secondary | ICD-10-CM

## 2020-09-25 DIAGNOSIS — Z01419 Encounter for gynecological examination (general) (routine) without abnormal findings: Secondary | ICD-10-CM | POA: Diagnosis not present

## 2020-09-25 NOTE — Patient Instructions (Signed)
[facility] [address]  [place] [address]  [facility] [place]  [address] [place] [address]  Phone: [phone]  

## 2020-09-25 NOTE — Progress Notes (Signed)
Gynecology Annual Exam  PCP: [facility]  Chief Complaint: No chief complaint on file.   History of Present Illness: Patient is a 55 y.o. [id number] presents for annual exam. The patient has no complaints today.   LMP: No LMP recorded. Patient is postmenopausal. No PMB  The patient is not currently sexually active.   The patient does perform self breast exams.  There is notable family history of breast or ovarian cancer in her family.  The patient wears seatbelts: yes.   The patient has regular exercise: not asked.    The patient denies current symptoms of depression.    Review of Systems: Review of Systems  Constitutional:  Negative for chills and fever.  HENT:  Negative for congestion.   Respiratory:  Negative for cough and shortness of breath.   Cardiovascular:  Negative for chest pain and palpitations.  Gastrointestinal:  Negative for abdominal pain, constipation, diarrhea, heartburn, nausea and vomiting.  Genitourinary:  Negative for dysuria, frequency and urgency.  Skin:  Negative for itching and rash.  Neurological:  Negative for dizziness and headaches.  Endo/Heme/Allergies:  Negative for polydipsia.  Psychiatric/Behavioral:  Negative for depression.    Past Medical History:  Patient Active Problem List   Diagnosis Date Noted   Other chest pain 08/23/2019   Shortness of breath 08/23/2019   Hypertension, essential 09/20/2017   Positive colorectal cancer screening using Cologuard test    Noninfectious gastroenteritis    History of Lyme disease 12/09/2015    Overview:  2017 + IGG neg IGM western blot  Treated with 28 days doxy      Psoriasis 11/20/2015   Recurrent UTI (urinary tract infection) 11/20/2015   Systemic lupus (HCC) 11/20/2015    Overview:  a. Prednisone.  b. Plaquenil.  c.  Positive FANA, anti-DNA, low complement.  d. Neutropenia, anemia.  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 Patient is postmenopausal. Contraception: post menopausal status Last Pap: Results were: 6/22/2022NIL and HR HPV negative  Last mammogram: 02/10/2020 Results were: Elby Showers I  Obstetric History: [id number]  Family History:  Family History  Problem Relation Age of Onset   Kidney disease Mother        only one kidney from accident at childhood   Lung cancer Father 96   Bladder Cancer Neg Hx    Breast cancer Neg Hx     Social History:  Social History   Socioeconomic History   Marital status: Married    Spouse name: Not on file   Number of children: Not on file   Years of education: Not on file   Highest education level: Not on file  Occupational History   Not on file  Tobacco Use   Smoking status: Never   Smokeless tobacco: Never  Vaping Use   Vaping Use: Never used  Substance and Sexual Activity   Alcohol use: No   Drug use: No   Sexual activity: Yes    Birth control/protection: None  Other Topics Concern   Not on file  Social History Narrative   Not on file   Social Determinants of Health   Financial Resource Strain: Not on file  Food Insecurity: Not on file  Transportation Needs: Not on file  Physical Activity: Not on file  Stress: Not on file  Social Connections: Not on file  Intimate Partner Violence: Not on file    Allergies:  Allergies  Allergen Reactions   Ciprofloxacin Swelling    Beef Allergy     Due to tick bite   Chicken Allergy     Alpha-gal - all mammal products   Doxycycline Nausea Only and Other (See Comments)    GI Upset   Gelatin     Alpha gal   Meat Extract     Alpha-gal - all mammal products   Milk-Related Compounds Hives and Swelling    Alpha gal   Sulfa Antibiotics Hives    Medications: Prior to Admission medications   Medication Sig Start Date End Date Taking? Authorizing Provider  azelastine (ASTELIN) 0.1 % nasal spray Place into the nose. 03/07/19   [provider]  betamethasone valerate ointment (VALISONE) 0.1 % Apply 1 application topically 2 (two) times daily. 11/12/19   Felecia Shelling, DPM  clindamycin (CLEOCIN) 300 MG capsule 300mg  twice daily 01/29/18   [provider]  diphenhydrAMINE (BENADRYL) 25 mg capsule Take 25 mg by mouth.    [provider]  EPINEPHrine 0.3 mg/0.3 mL IJ SOAJ injection Inject into the muscle. 01/06/16   [provider]  famotidine (PEPCID) 40 MG tablet Take by mouth.    [provider]  Hydroxychloroquine Sulfate (PLAQUENIL PO) Take by mouth.    [provider]  ibuprofen (ADVIL) 600 MG tablet As needed 03/28/13   [provider]  metoprolol tartrate (LOPRESSOR) 25 MG tablet Take by mouth. 11/01/10   [provider]  mupirocin ointment (BACTROBAN) 2 % Apply 1 application topically 2 (two) times daily. 11/12/19   01/12/20, DPM  PROAIR RESPICLICK 108 [phone] Base) MCG/ACT AEPB SMARTSIG:2 Puff(s) By Mouth Every 6 Hours PRN 09/15/19   [provider]  terbinafine (LAMISIL) 250 MG tablet Take 1 tablet (250 mg total) by mouth daily. 11/12/19   01/12/20, DPM    Physical Exam Vitals: Blood pressure 138/86, pulse 79, height 5' 7.5" (1.715 m), weight 242 lb (109.8 kg).  General: NAD HEENT: normocephalic, anicteric Thyroid: no enlargement, no palpable nodules Pulmonary: No increased work of breathing, CTAB Cardiovascular: RRR, distal pulses  2+ Breast: Breast symmetrical, no tenderness, no palpable nodules or masses, no skin or nipple retraction present, no nipple discharge.  No axillary or supraclavicular lymphadenopathy. Abdomen: NABS, soft, non-tender, non-distended.  Umbilicus without lesions.  No hepatomegaly, splenomegaly or masses palpable. No evidence of hernia  Genitourinary:  External: Normal external female genitalia.  Normal urethral meatus, normal Bartholin's and Skene's glands.    Vagina: Normal vaginal mucosa, no evidence of prolapse.    Cervix: Grossly normal in appearance, no bleeding  Uterus: Non-enlarged, mobile, normal contour.  No CMT  Adnexa: ovaries non-enlarged, no adnexal masses  Rectal: deferred  Lymphatic: no evidence of inguinal lymphadenopathy Extremities: no edema, erythema, or tenderness Neurologic: Grossly intact Psychiatric: mood appropriate, affect full  Female chaperone present for pelvic and breast  portions of the physical exam  Assessment: 55 y.o. [id number] routine annual exam  Plan: Problem List Items Addressed This Visit   None Visit Diagnoses     Encounter for gynecological examination without abnormal finding    -  Primary   Breast screening       Relevant Orders   MM 3D SCREEN BREAST BILATERAL   Screening for malignant neoplasm of cervix       Relevant Orders   IGP, Aptima HPV, rfx 16/18,45       1) Mammogram - recommend yearly screening mammogram.  Mammogram Is up to date   2) STI screening  was notoffered and therefore not obtained  3) ASCCP guidelines and rational discussed.  Patient opts for yearly screening interval  4) Colonoscopy - UTD 11/04/2016   5) Routine healthcare maintenance including cholesterol, diabetes screening discussed managed by PCP  7) No follow-ups on file.   Vena Austria, MD, [facility], [facility] 09/25/2020, 8:33 AM

## 2020-09-29 LAB — IGP, APTIMA HPV, RFX 16/18,45: HPV Aptima: NEGATIVE

## 2021-02-12 ENCOUNTER — Ambulatory Visit
Admission: RE | Admit: 2021-02-12 | Discharge: 2021-02-12 | Disposition: A | Discharge status: Home / Self Care | Payer: 59 | Source: Ambulatory Visit | Attending: Obstetrics and Gynecology | Admitting: Obstetrics and Gynecology

## 2021-02-12 ENCOUNTER — Other Ambulatory Visit: Payer: Self-pay

## 2021-02-12 DIAGNOSIS — Z1239 Encounter for other screening for malignant neoplasm of breast: Secondary | ICD-10-CM

## 2021-02-12 DIAGNOSIS — Z1231 Encounter for screening mammogram for malignant neoplasm of breast: Secondary | ICD-10-CM | POA: Diagnosis not present

## 2021-02-16 ENCOUNTER — Encounter: Payer: Self-pay | Admitting: Obstetrics and Gynecology

## 2021-02-23 LAB — HM DIABETES EYE EXAM

## 2021-03-11 ENCOUNTER — Other Ambulatory Visit
Admission: RE | Admit: 2021-03-11 | Discharge: 2021-03-11 | Disposition: A | Discharge status: Home / Self Care | Payer: 59 | Source: Ambulatory Visit | Attending: Family Medicine | Admitting: Family Medicine

## 2021-03-11 DIAGNOSIS — R600 Localized edema: Secondary | ICD-10-CM | POA: Insufficient documentation

## 2021-03-11 LAB — BRAIN NATRIURETIC PEPTIDE: B Natriuretic Peptide: 29 pg/mL (ref 0.0–100.0)

## 2021-03-30 ENCOUNTER — Encounter: Payer: Self-pay | Admitting: Family Medicine

## 2021-05-11 ENCOUNTER — Ambulatory Visit: Payer: Managed Care, Other (non HMO) | Admitting: Dermatology

## 2021-05-13 ENCOUNTER — Encounter: Payer: Self-pay | Admitting: *Deleted

## 2021-09-18 ENCOUNTER — Emergency Department
Admission: EM | Admit: 2021-09-18 | Discharge: 2021-09-18 | Disposition: A | Discharge status: Home / Self Care | Payer: 59 | Attending: Emergency Medicine | Admitting: Emergency Medicine

## 2021-09-18 ENCOUNTER — Encounter: Payer: Self-pay | Admitting: Emergency Medicine

## 2021-09-18 ENCOUNTER — Emergency Department: Payer: 59

## 2021-09-18 ENCOUNTER — Other Ambulatory Visit: Payer: Self-pay

## 2021-09-18 DIAGNOSIS — Z8673 Personal history of transient ischemic attack (TIA), and cerebral infarction without residual deficits: Secondary | ICD-10-CM | POA: Diagnosis not present

## 2021-09-18 DIAGNOSIS — J40 Bronchitis, not specified as acute or chronic: Secondary | ICD-10-CM | POA: Diagnosis not present

## 2021-09-18 DIAGNOSIS — R531 Weakness: Secondary | ICD-10-CM

## 2021-09-18 DIAGNOSIS — R059 Cough, unspecified: Secondary | ICD-10-CM | POA: Diagnosis present

## 2021-09-18 DIAGNOSIS — Z20822 Contact with and (suspected) exposure to covid-19: Secondary | ICD-10-CM | POA: Insufficient documentation

## 2021-09-18 DIAGNOSIS — R051 Acute cough: Secondary | ICD-10-CM

## 2021-09-18 LAB — COMPREHENSIVE METABOLIC PANEL
ALT: 18 U/L (ref 0–44)
AST: 20 U/L (ref 15–41)
Albumin: 3.6 g/dL (ref 3.5–5.0)
Alkaline Phosphatase: 70 U/L (ref 38–126)
Anion gap: 4 — ABNORMAL LOW (ref 5–15)
BUN: 13 mg/dL (ref 6–20)
CO2: 25 mmol/L (ref 22–32)
Calcium: 8.8 mg/dL — ABNORMAL LOW (ref 8.9–10.3)
Chloride: 109 mmol/L (ref 98–111)
Creatinine, Ser: 0.92 mg/dL (ref 0.44–1.00)
GFR, Estimated: >60 mL/min (ref >60)
Glucose, Bld: 90 mg/dL (ref 70–99)
Potassium: 3.7 mmol/L (ref 3.5–5.1)
Sodium: 138 mmol/L (ref 135–145)
Total Bilirubin: 0.3 mg/dL (ref 0.3–1.2)
Total Protein: 7.9 g/dL (ref 6.5–8.1)

## 2021-09-18 LAB — CBC WITH DIFFERENTIAL/PLATELET
Abs Immature Granulocytes: 0.01 10*3/uL (ref 0.00–0.07)
Basophils Absolute: 0 10*3/uL (ref 0.0–0.1)
Basophils Relative: 0 %
Eosinophils Absolute: 0.2 10*3/uL (ref 0.0–0.5)
Eosinophils Relative: 4 %
HCT: 40.3 % (ref 36.0–46.0)
Hemoglobin: 12.9 g/dL (ref 12.0–15.0)
Immature Granulocytes: 0 %
Lymphocytes Relative: 21 %
Lymphs Abs: 0.8 10*3/uL (ref 0.7–4.0)
MCH: 28.2 pg (ref 26.0–34.0)
MCHC: 32 g/dL (ref 30.0–36.0)
MCV: 88.2 fL (ref 80.0–100.0)
Monocytes Absolute: 0.3 10*3/uL (ref 0.1–1.0)
Monocytes Relative: 7 %
Neutro Abs: 2.7 10*3/uL (ref 1.7–7.7)
Neutrophils Relative %: 68 %
Platelets: 181 10*3/uL (ref 150–400)
RBC: 4.57 MIL/uL (ref 3.87–5.11)
RDW: 13.3 % (ref 11.5–15.5)
WBC: 4 10*3/uL (ref 4.0–10.5)
nRBC: 0 % (ref 0.0–0.2)

## 2021-09-18 LAB — SARS CORONAVIRUS 2 BY RT PCR: SARS Coronavirus 2 by RT PCR: NEGATIVE

## 2021-09-18 LAB — TROPONIN I (HIGH SENSITIVITY): Troponin I (High Sensitivity): 4 ng/L (ref <18)

## 2021-09-18 LAB — BRAIN NATRIURETIC PEPTIDE: B Natriuretic Peptide: 7.7 pg/mL (ref 0.0–100.0)

## 2021-09-18 MED ORDER — PREDNISONE 20 MG PO TABS: ORAL_TABLET | ORAL | 0 refills | Status: AC

## 2021-09-18 MED ORDER — AZITHROMYCIN 250 MG PO TABS: ORAL_TABLET | ORAL | 0 refills | Status: DC

## 2021-09-18 MED ORDER — ASPIRIN 81 MG PO TBEC: 81.0000 mg | DELAYED_RELEASE_TABLET | Freq: Every day | ORAL | 0 refills | Status: AC

## 2021-09-18 MED ORDER — HYDROCODONE BIT-HOMATROP MBR 5-1.5 MG/5ML PO SOLN: 5.0000 mL | Freq: Four times a day (QID) | ORAL | 0 refills | Status: AC | PRN

## 2021-09-18 NOTE — ED Triage Notes (Signed)
Pt via [facility] from home. Pt c/o congestion and cough for the past weeks states that she is not coughing. States it feels tight in her chest when she cough. Denies any lung issues has a hx of bronchitis. Pt states she had an episode of numbness from head to the toe on the L side when she woke up Tuesday morning, states it only lasted a couple of hours. Pt is A&Ox4 and NAD

## 2021-09-18 NOTE — ED Notes (Signed)
Patient ambulated from department with a steady gait. No further complaints.

## 2021-09-18 NOTE — ED Triage Notes (Signed)
First Nurse Note:  Arrives from [facility] for ED evaluation. Patient presented to clinic for C/O week long history of cough, non productive.  States woke up Tuesday morning with left leg pain / tingling and right headache.  Per report, patient with slight left sided weakness with weaker left hand grip.  Patient states she has had a stroke in the past, but unsure when and states that her doctor had noticed some unequal movement to face, unsure which side was affected.  Patietn is AAOx3.  Skin warm and dry.  NAD.  No SOB/ DOE.  No cough noted.

## 2021-09-18 NOTE — ED Provider Notes (Signed)
[facility] Provider Note    Event Date/Time   First MD Initiated Contact with Patient 09/18/21 1132     (approximate)   History   Cough and Nasal Congestion   HPI  Jacqueline [name] is a 56 y.o. female here with multiple complaints.  The patient's primary complaint is actually cough and some shortness of breath.  The patient states this has been ongoing for the last 2 weeks or so.  She has had intermittent sputum production.  She has had a harsh, barking type cough.  She went to her physician today for the symptoms, but also told them that she has had left-sided facial, arm, and leg numbness as well as weakness for the last several days.  She states this began fairly acutely several days ago, when she woke up from sleep and noticed left face, arm, and leg numbness.  That numbness has improved, but she has had persistent left arm as well as left leg weakness since then.  She went to urgent care today and was sent here for further evaluation.  Denies history of previous neurological deficits, though she has been told she has had a mini stroke in the past.  She has a history of lupus.  Denies known pulmonary history of her lupus.  No other complaints.     Physical Exam   Triage Vital Signs: ED Triage Vitals  Enc Vitals Group     BP 09/18/21 1126 129/62     Pulse Rate 09/18/21 1126 70     Resp 09/18/21 1126 18     Temp 09/18/21 1126 98.2 F (36.8 C)     Temp src --      SpO2 09/18/21 1126 98 %     Weight 09/18/21 1125 245 lb (111.1 kg)     Height 09/18/21 1125 5' 7.5" (1.715 m)     Head Circumference --      Peak Flow --      Pain Score 09/18/21 1124 0     Pain Loc --      Pain Edu? --      Excl. in GC? --     Most recent vital signs: Vitals:   09/18/21 1455 09/18/21 1500  BP: 132/73 124/69  Pulse: 64 65  Resp: 20   Temp:    SpO2: 100% 95%     General: Awake, no distress.  CV:  Good peripheral perfusion.  Regular rate and rhythm. Resp:  Normal  effort.  Mildly coarse breath sounds, but otherwise clear.  Speaking in full sentences. Abd:  No distention.  No tenderness. Other:  Very subtle left nasolabial flattening.  Tongue protrusion midline.  Cranial nerves otherwise intact.  Strength 5 out of 5 bilateral upper and lower extremities though significantly weakened in the left upper and left lower extremity, despite this being her dominant side.  Normal sensation to light touch.   ED Results / Procedures / Treatments   Labs (all labs ordered are listed, but only abnormal results are displayed) Labs Reviewed  COMPREHENSIVE METABOLIC PANEL - Abnormal; Notable for the following components:      Result Value   Calcium 8.8 (*)    Anion gap 4 (*)    All other components within normal limits  SARS CORONAVIRUS 2 BY RT PCR  CBC WITH DIFFERENTIAL/PLATELET  BRAIN NATRIURETIC PEPTIDE  TROPONIN I (HIGH SENSITIVITY)     EKG Normal sinus rhythm, ventricular rate 64.  PR 170, QRS 88, QTc 433.  No acute ST elevations or depressions.  No EKG evidence of acute ischemia or infarct.   RADIOLOGY Chest x-ray: No active disease CT head: No acute intracranial abnormality MRI brain/C-spine: Normal MRI appearance of the brain, mild degenerative changes in the cervical spine   I also independently reviewed and agree with radiologist interpretations.   PROCEDURES:  Critical Care performed: No   MEDICATIONS ORDERED IN ED: Medications - No data to display   IMPRESSION / MDM / ASSESSMENT AND PLAN / ED COURSE  I reviewed the triage vital signs and the nursing notes.                               The patient is on the cardiac monitor to evaluate for evidence of arrhythmia and/or significant heart rate changes.   Ddx:  Differential includes the following, with pertinent life- or limb-threatening emergencies considered:  Regarding cough, concern for bronchitis, pneumonia, possible lupus related pneumonitis or pulmonary involvement, less  likely asthma, allergies, bronchospasm, medication effect, postnasal drainage, GERD  Regarding her weakness, patient has a history of prior weakness in a similar distribution so primary consideration is recrudescence of old stroke like deficits in the setting of acute medical illness, differential includes stroke, cervical radiculopathy, lupus meningeal encephalitis  Patient's presentation is most consistent with acute presentation with potential threat to life or bodily function.  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  Primary complaint is cough, which has been ongoing for 2 weeks.  The patient is not hypoxic.  She has normal work of breathing.  Chest x-ray is clear with no evidence of pneumonia.  She has some scant coarse breath sounds and occasional wheezes.  History of possible asthma.  CBC and CMP are unremarkable with no anemia, leukocytosis.  She has no evidence to suggest infection.  Normal renal function.  No signs of heart failure.  EKG is nonischemic.  COVID is negative.  Troponin and BNP are negative with no signs of ACS or CHF.  Regarding her weakness, she does have some subtle left-sided weakness although she is overtly 5 out of 5 in the left upper and lower extremities.  She has had mention of left-sided weakness as well as left facial droop on previous history and physicals.  Suspect possible recrudescence of old stroke.  However, given the persistence of her symptoms, MRI obtained, reviewed as above.  Patient has no evidence of acute infarct despite constant symptoms for several days, do not suspect CVA.  She has some cervical radicular symptoms which could be contributing, but no evidence of acute disc herniation or cord edema.  Her symptoms are improved in the ED.  Given her risk factors, I do think it is pertinent to start a low-dose aspirin, follow-up with neurology, but she is otherwise at  her baseline and feels well.  She would like to continue management as an outpatient which I think is reasonable.  Will place her on a course of antibiotics and a prolonged steroid taper for her pulmonary symptoms, and this would also potentially improve any kind of lupus related CNS symptoms.  She has an appointment with pulmonology on Monday.  Return precautions given.   MEDICATIONS GIVEN IN ED: Medications - No data to display   Consults:     EMR reviewed  Rhemautology notes, visit 08/2021 with Danella Maiers     FINAL CLINICAL IMPRESSION(S) /  ED DIAGNOSES   Final diagnoses:  Acute cough  Bronchitis  Left-sided weakness     Rx / DC Orders   ED Discharge Orders          Ordered    predniSONE (DELTASONE) 20 MG tablet  Daily        09/18/21 1519    azithromycin (ZITHROMAX Z-PAK) 250 MG tablet        09/18/21 1519    HYDROcodone bit-homatropine (HYCODAN) 5-1.5 MG/5ML syrup  Every 6 hours PRN        09/18/21 1519    aspirin EC 81 MG tablet  Daily        09/18/21 1520             Note:  This document was prepared using Dragon voice recognition software and may include unintentional dictation errors.   Shaune Pollack, MD 09/18/21 [phone]

## 2021-09-18 NOTE — ED Notes (Signed)
Patient was able to complete assessment with left leg, but with less fluidity and some hesitation. States " her leg feels heavy" referencing left leg. Patient taken to imaging.

## 2021-09-18 NOTE — ED Notes (Signed)
Patient was taken to imaging prior to reading pulse and pulse ox.

## 2021-09-18 NOTE — Discharge Instructions (Signed)
Follow up with Pulmonology next week  I'd call your regular doctor or Rheumatologist to get follow-up with NEUROLOGY in the next 1-2 weeks. In the interim, I'd recommend starting a low-dose baby aspirin daily.

## 2021-10-01 ENCOUNTER — Encounter: Payer: Self-pay | Admitting: Licensed Practical Nurse

## 2021-10-01 ENCOUNTER — Ambulatory Visit (INDEPENDENT_AMBULATORY_CARE_PROVIDER_SITE_OTHER): Payer: 59 | Admitting: Licensed Practical Nurse

## 2021-10-01 VITALS — BP 110/80 | Resp 15 | Ht 67.5 in | Wt 244.2 lb

## 2021-10-01 DIAGNOSIS — Z01419 Encounter for gynecological examination (general) (routine) without abnormal findings: Secondary | ICD-10-CM | POA: Diagnosis not present

## 2021-10-01 DIAGNOSIS — Z124 Encounter for screening for malignant neoplasm of cervix: Secondary | ICD-10-CM

## 2021-10-01 NOTE — Progress Notes (Signed)
Gynecology Annual Exam  PCP: [facility]  Chief Complaint:  Chief Complaint  Patient presents with  . Annual Exam    History of Present Illness:Patient is a 56 y.o. Jacqueline [name] presents for annual exam. The patient has no complaints today.   LMP: No LMP recorded. Patient is postmenopausal. Menarche:{numbers FC:[id number] Average Interval: {Desc; regular/irreg:14544}, {numbers 22-35:14824} days Duration of flow: {numbers; 0-10:33138} days Heavy Menses: {yes/no:63} Clots: {yes/no:63} Intermenstrual Bleeding: {yes/no:63} Postcoital Bleeding: {yes/no:63} Dysmenorrhea: {yes/no:63}  The patient {sys sexually active:13135} sexually active. She {has/denies:315300} dyspareunia.  The patient {DOES_DOES NF:2365131 perform self breast exams.  There {is/is no:19420} notable family history of breast or ovarian cancer in her family.  The patient wears seatbelts: {yes/no:63}.   The patient has regular exercise: {yes/no/not asked:9010}.    The patient {Blank single:19197::"reports","denies"} current symptoms of depression.     Review of Systems: ROS  Past Medical History:  Patient Active Problem List   Diagnosis Date Noted  . Other chest pain 08/23/2019  . Shortness of breath 08/23/2019  . Hypertension, essential 09/20/2017  . Positive colorectal cancer screening using Cologuard test   . Noninfectious gastroenteritis   . History of Lyme disease 12/09/2015    Overview:  2017 + IGG neg IGM western blot  Treated with 28 days doxy    . Psoriasis 11/20/2015  . Recurrent UTI (urinary tract infection) 11/20/2015  . Systemic lupus ([place]) 11/20/2015    Overview:  a. Prednisone.  b. Plaquenil.  c.  Positive FANA, anti-DNA, low complement.  d. Neutropenia, anemia.  [facility])   . History of shingles 04/24/2015  . Post herpetic neuralgia 04/24/2015  . Food allergy 12/11/2014  . Mild obesity 10/03/2014  . Lymphadenopathy 09/19/2013  . Systemic lupus erythematosus ([place]) 06/13/2012  .  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 CESAREAN SECTION    . COLONOSCOPY WITH PROPOFOL N/A 11/04/2016   Procedure: COLONOSCOPY WITH PROPOFOL;  Surgeon: Lucilla Lame, MD;  Location: [place];  Service: Gastroenterology;  Laterality: N/A;  . CYST EXCISION     biopsy - neck  . VULVA /PERINEUM BIOPSY      Gynecologic History:  No LMP recorded. Patient is postmenopausal. Last Pap: Results were: *** {Findings; lab pap smear results:16707::"NIL and HR HPV+","NIL and HR HPV negative"}  Last mammogram: *** Results were: {Blank single:19197::"***","BI-RADS IV","BI-RAD III","BI-RAD II","BI-RAD I"}  Obstetric HistoryAD:[id number]  Family History:  Family History  Problem Relation Age of Onset  . Hypertension Mother   . Kidney disease Mother        only one kidney from accident at childhood  . Hypertension Father   . Lung cancer Father 77  . Bladder Cancer Neg Hx   . Breast cancer Neg Hx     Social History:  Social History   Socioeconomic History  . Marital status: Married    Spouse name: Not on file  . Number of children: Not on file  . Years of education: Not on file  . Highest education level: Not on file  Occupational History  . Not on file  Tobacco Use  . Smoking status: Never  . Smokeless tobacco: Never  Vaping Use  . Vaping Use: Never used  Substance and Sexual Activity  . Alcohol use: No  . Drug use: No  . Sexual activity: Not Currently    Birth control/protection: None  Other Topics Concern  . Not on  file  Social History Narrative  . Not on file   Social Determinants of Health   Financial Resource Strain: Not on file  Food Insecurity: Not on file  Transportation Needs: Not on file  Physical Activity: Inactive (08/24/2018)   Exercise Vital Sign   . Days of Exercise per Week: 0 days   . Minutes of Exercise per Session: 0 min  Stress: No Stress  Concern Present (08/24/2018)   [employer] of Occupational Health - Occupational Stress Questionnaire   . Feeling of Stress : Only a little  Social Connections: Not on file  Intimate Partner Violence: Not on file    Allergies:  Allergies  Allergen Reactions  . Beef Allergy Anaphylaxis    Due to tick bite  . Ciprofloxacin Swelling  . Pork Allergy Anaphylaxis  . Chicken Allergy Other (See Comments)    Alpha-gal - all mammal products Alpha-gal - all mammal products Alpha-gal - all mammal products  . Doxycycline Nausea Only and Other (See Comments)    GI Upset  . Gelatin     Alpha gal  . Meat Extract     Alpha-gal - all mammal products  . Milk-Related Compounds Hives and Swelling    Alpha gal  . Sulfa Antibiotics Hives    Medications: Prior to Admission medications   Medication Sig Start Date End Date Taking? Authorizing Provider  aspirin EC 81 MG tablet Take 1 tablet (81 mg total) by mouth daily. Swallow whole. 09/18/21 11/17/21 Yes Shaune Pollack, MD  EPINEPHrine 0.3 mg/0.3 mL IJ SOAJ injection Inject into the muscle. 01/06/16  Yes [provider]  Hydroxychloroquine Sulfate (PLAQUENIL PO) Take by mouth.   Yes [provider]  ibuprofen (ADVIL) 600 MG tablet As needed 03/28/13  Yes [provider]  metoprolol tartrate (LOPRESSOR) 25 MG tablet Take by mouth. 11/01/10  Yes [provider]  PROAIR RESPICLICK 108 (90 Base) MCG/ACT AEPB SMARTSIG:2 Puff(s) By Mouth Every 6 Hours PRN 09/15/19  Yes [provider]  terbinafine (LAMISIL) 250 MG tablet Take 1 tablet (250 mg total) by mouth daily. 11/12/19  Yes Felecia Shelling, DPM  azithromycin (ZITHROMAX Z-PAK) 250 MG tablet Take 2 tablets (500 mg) on  Day 1,  followed by 1 tablet (250 mg) once daily on Days 2 through 5. 09/18/21   Shaune Pollack, MD  diphenhydrAMINE (BENADRYL) 25 mg capsule Take 25 mg by mouth. Patient not taking: Reported on 10/01/2021    [provider]  HYDROcodone  bit-homatropine (HYCODAN) 5-1.5 MG/5ML syrup Take 5 mLs by mouth every 6 (six) hours as needed for cough. Patient not taking: Reported on 10/01/2021 09/18/21   Shaune Pollack, MD    Physical Exam Vitals: Blood pressure 110/80, resp. rate 15, height 5' 7.5" (1.715 m), weight 244 lb 3.2 oz (110.8 kg).  General: NAD HEENT: normocephalic, anicteric Thyroid: no enlargement, no palpable nodules Pulmonary: No increased work of breathing, CTAB Cardiovascular: RRR, distal pulses 2+ Breast: Breast symmetrical, no tenderness, no palpable nodules or masses, no skin or nipple retraction present, no nipple discharge.  No axillary or supraclavicular lymphadenopathy. Abdomen: NABS, soft, non-tender, non-distended.  Umbilicus without lesions.  No hepatomegaly, splenomegaly or masses palpable. No evidence of hernia  Genitourinary:  External: Normal external female genitalia.  Normal urethral meatus, normal Bartholin's and Skene's glands.    Vagina: Normal vaginal mucosa, no evidence of prolapse.    Cervix: Grossly normal in appearance, no bleeding  Uterus: Non-enlarged, mobile, normal contour.  No CMT  Adnexa: ovaries non-enlarged,  no adnexal masses  Rectal: deferred  Lymphatic: no evidence of inguinal lymphadenopathy Extremities: no edema, erythema, or tenderness Neurologic: Grossly intact Psychiatric: mood appropriate, affect full  Female chaperone present for pelvic and breast  portions of the physical exam     Assessment: 56 y.o. [id number] routine annual exam  Plan: Problem List Items Addressed This Visit   None   1) Mammogram - recommend yearly screening mammogram.  Mammogram {Blank single:19197::"Is up to date","Was ordered today"}  2) STI screening  {Blank single:19197::"was","was not"}offered and {Blank single:19197::"accepted","declined","therefore not obtained"}  3) ASCCP guidelines and rational discussed.  Patient opts for {Blank single:19197::"***","every 5 years","every 3  years","yearly","discontinue age >65","discontinue secondary to prior hysterectomy"} screening interval  4) Osteoporosis  - per USPTF routine screening DEXA at age 21 - FRAX 10 year major fracture risk ***,  10 year hip fracture risk ***  Consider FDA-approved medical therapies in postmenopausal women and men aged 78 years and older, based on the following: a) A hip or vertebral (clinical or morphometric) fracture b) T-score = -2.5 at the femoral neck or spine after appropriate evaluation to exclude secondary causes C) Low bone mass (T-score between -1.0 and -2.5 at the femoral neck or spine) and a 10-year probability of a hip fracture = 3% or a 10-year probability of a major osteoporosis-related fracture = 20% based on the [place]-adapted WHO algorithm   5) Routine healthcare maintenance including cholesterol, diabetes screening discussed {Blank single:19197::"managed by PCP","Ordered today","To return fasting at a later date","Declines"}  6) Colonoscopy UP to Date.  Screening recommended starting at age 28 for average risk individuals, age 83 for individuals deemed at increased risk (including African Americans) and recommended to continue until age 1.  For patient age 56-85 individualized approach is recommended.  Gold standard screening is via colonoscopy, Cologuard screening is an acceptable alternative for patient unwilling or unable to undergo colonoscopy.  "Colorectal cancer screening for average-risk adults: 2018 guideline update from the American Cancer Society"CA: A Cancer Journal for Clinicians: Aug 03, 2016   7) No follow-ups on file.   Carie Caddy, CNM  [facility] Pulse, [facility] 10/01/2021, 2:25 PM

## 2021-10-06 LAB — IGP, APTIMA HPV: HPV Aptima: NEGATIVE

## 2021-10-26 ENCOUNTER — Ambulatory Visit: Payer: 59

## 2021-10-26 ENCOUNTER — Ambulatory Visit (INDEPENDENT_AMBULATORY_CARE_PROVIDER_SITE_OTHER): Payer: 59 | Admitting: Podiatry

## 2021-10-26 ENCOUNTER — Ambulatory Visit (INDEPENDENT_AMBULATORY_CARE_PROVIDER_SITE_OTHER): Payer: 59

## 2021-10-26 DIAGNOSIS — M722 Plantar fascial fibromatosis: Secondary | ICD-10-CM

## 2021-10-26 DIAGNOSIS — S93601A Unspecified sprain of right foot, initial encounter: Secondary | ICD-10-CM

## 2021-10-26 MED ORDER — MELOXICAM 15 MG PO TABS: 15.0000 mg | ORAL_TABLET | Freq: Every day | ORAL | 1 refills | Status: DC

## 2021-10-26 MED ORDER — BETAMETHASONE SOD PHOS & ACET 6 (3-3) MG/ML IJ SUSP
3.0000 mg | Freq: Once | INTRAMUSCULAR | Status: AC
  Administered 2021-10-26: 3 mg via INTRA_ARTICULAR

## 2021-10-26 NOTE — Progress Notes (Signed)
   Chief Complaint  Patient presents with   Foot Pain    Left foot pain, patient twisted right foot on yesterday.    Subjective: 56 y.o. female presenting today as a new patient for evaluation of 2 separate complaints.  First the patient states that she has been experiencing left heel pain for several months now.  Denies a history of injury.  She actually went to the emergency department and was referred for podiatry.  She has pain on a daily basis especially when getting out of bed in the mornings  Patient also states that this past Saturday she sustained a trip and fall injury to her right foot.  She has noticed some pain and swelling ever since.  She is concerned and would like to have it evaluated as well   Past Medical History:  Diagnosis Date   Allergy    Cardiomegaly    DVT (deep venous thrombosis) (HCC)    history of - Right arm   Facial droop    left side (from possible old stroke(?))   GERD (gastroesophageal reflux disease)    Hypertension    Leaky heart valve    Lupus (HCC)    Motion sickness    ocean ships   Orthodontics    wears braces   Pre-diabetes    Sleep apnea    CPAP   Urethral stricture    Wears contact lenses      Objective: Physical Exam General: The patient is alert and oriented x3 in no acute distress.  Dermatology: Skin is warm, dry and supple bilateral lower extremities. Negative for open lesions or macerations bilateral.   Vascular: Dorsalis Pedis and Posterior Tibial pulses palpable bilateral.  Capillary fill time is immediate to all digits..  There is some mild diffuse swelling to the right foot consistent with foot sprain  Neurological: Epicritic and protective threshold intact bilateral.   Musculoskeletal: Tenderness to palpation to the plantar aspect of the left heel along the plantar fascia. All other joints range of motion within normal limits bilateral. Strength 5/5 in all groups bilateral.   Radiographic exam B/L feet 10/26/2021:  Normal osseous mineralization. Joint spaces preserved. No fracture/dislocation/boney destruction. No other soft tissue abnormalities or radiopaque foreign bodies.  No acute fractures to the right foot  Assessment: 1. Plantar fasciitis left foot 2.  Foot sprain RT foot; initial encounter  Plan of Care:  1. Patient evaluated. Xrays reviewed.   2. Injection of 0.5cc Celestone soluspan injected into the left plantar fascia.  3.  Compression ankle sleeve dispensed for the right foot.  Wear daily 4. Rx for Meloxicam ordered for patient. 5.  Recommend supportive sneakers 6. Instructed patient regarding therapies and modalities at home to alleviate symptoms.  7. Return to clinic in 4 weeks.     Felecia Shelling, DPM [facility]  Dr. Felecia Shelling, DPM    [address] [place], [place] [address]                Office [phone]  Fax [phone]

## 2021-12-03 ENCOUNTER — Encounter: Payer: 59 | Admitting: Podiatry

## 2021-12-10 ENCOUNTER — Ambulatory Visit (INDEPENDENT_AMBULATORY_CARE_PROVIDER_SITE_OTHER): Payer: 59 | Admitting: Podiatry

## 2021-12-10 DIAGNOSIS — M722 Plantar fascial fibromatosis: Secondary | ICD-10-CM

## 2021-12-10 NOTE — Progress Notes (Signed)
   Chief Complaint  Patient presents with   Follow-up    Patient is here for 4 week follow-up for left foot plantar fasciitis.the patient states that her left foot does not have any more pain after injection.    Subjective: 56 y.o. female presenting today for follow-up evaluation of plantar fasciitis to the left heel as well as right foot pain.  Patient states that she is doing significantly better.  She has no pain or tenderness associated to both feet.  She presents for further treatment and evaluation   Past Medical History:  Diagnosis Date   Allergy    Cardiomegaly    DVT (deep venous thrombosis) (HCC)    history of - Right arm   Facial droop    left side (from possible old stroke(?))   GERD (gastroesophageal reflux disease)    Hypertension    Leaky heart valve    Lupus ([place])    Motion sickness    ocean ships   Orthodontics    wears braces   Pre-diabetes    Sleep apnea    CPAP   Urethral stricture    Wears contact lenses      Objective: Physical Exam General: The patient is alert and oriented x3 in no acute distress.  Dermatology: Skin is warm, dry and supple bilateral lower extremities. Negative for open lesions or macerations bilateral.   Vascular: Dorsalis Pedis and Posterior Tibial pulses palpable bilateral.  Capillary fill time is immediate to all digits..  There is some mild diffuse swelling to the right foot consistent with foot sprain  Neurological: Epicritic and protective threshold intact bilateral.   Musculoskeletal: Negative for any significant tenderness to palpation to the plantar aspect of the left heel along the plantar fascia. All other joints range of motion within normal limits bilateral. Strength 5/5 in all groups bilateral.   Radiographic exam B/L feet 10/26/2021: Normal osseous mineralization. Joint spaces preserved. No fracture/dislocation/boney destruction. No other soft tissue abnormalities or radiopaque foreign bodies.  No acute fractures to  the right foot  Assessment: 1. Plantar fasciitis left foot 2.  Foot sprain RT foot; initial encounter  Plan of Care:  1. Patient evaluated. 2.  Overall the patient is doing very well.  She denies any right foot pain and says it has resolved completely.  Patient also does not have any left heel pain 3.  Continue meloxicam as needed For.  Recommend good supportive shoes and sneakers 5.  Return to clinic as needed   Edrick Kins, DPM [facility]  Dr. Edrick Kins, DPM    [address],  [address]                Office [phone]  Fax [phone]

## 2022-01-07 ENCOUNTER — Other Ambulatory Visit: Payer: Self-pay | Admitting: Licensed Practical Nurse

## 2022-01-07 DIAGNOSIS — Z1231 Encounter for screening mammogram for malignant neoplasm of breast: Secondary | ICD-10-CM

## 2022-03-02 ENCOUNTER — Ambulatory Visit
Admission: RE | Admit: 2022-03-02 | Discharge: 2022-03-02 | Disposition: A | Discharge status: Home / Self Care | Payer: 59 | Source: Ambulatory Visit | Attending: Licensed Practical Nurse | Admitting: Licensed Practical Nurse

## 2022-03-02 DIAGNOSIS — Z1231 Encounter for screening mammogram for malignant neoplasm of breast: Secondary | ICD-10-CM | POA: Insufficient documentation

## 2023-01-17 ENCOUNTER — Other Ambulatory Visit: Payer: Self-pay | Admitting: Licensed Practical Nurse

## 2023-01-17 DIAGNOSIS — Z1231 Encounter for screening mammogram for malignant neoplasm of breast: Secondary | ICD-10-CM

## 2023-01-30 ENCOUNTER — Telehealth: Payer: Self-pay | Admitting: Podiatry

## 2023-01-30 NOTE — Telephone Encounter (Signed)
Patient stated she is not able to put on a shoe due to the corn on her foot, she does have an appt coming up in dec, but for the meantime she would like to know what she can do to help alleviate pain,  Thanks

## 2023-01-30 NOTE — Telephone Encounter (Signed)
If the pain is because of the symptomatic: She can begin applying corn and callus remover.  Also recommend wide shoes that do not irritate the corn and callus.  Thanks, Dr. Logan Bores

## 2023-02-17 ENCOUNTER — Ambulatory Visit: Payer: 59 | Admitting: Podiatry

## 2023-02-17 ENCOUNTER — Encounter: Payer: Self-pay | Admitting: Podiatry

## 2023-02-17 VITALS — Ht 67.5 in | Wt 244.0 lb

## 2023-02-17 DIAGNOSIS — M722 Plantar fascial fibromatosis: Secondary | ICD-10-CM | POA: Diagnosis not present

## 2023-02-17 DIAGNOSIS — D2372 Other benign neoplasm of skin of left lower limb, including hip: Secondary | ICD-10-CM

## 2023-02-17 MED ORDER — BETAMETHASONE SOD PHOS & ACET 6 (3-3) MG/ML IJ SUSP
3.0000 mg | Freq: Once | INTRAMUSCULAR | Status: AC
  Administered 2023-02-17: 3 mg via INTRA_ARTICULAR

## 2023-02-17 MED ORDER — MELOXICAM 15 MG PO TABS: 15.0000 mg | ORAL_TABLET | Freq: Every day | ORAL | 1 refills | Status: AC

## 2023-02-17 NOTE — Progress Notes (Signed)
Chief Complaint  Patient presents with   Callouses    RM6: A corn on my little toe on my left foot is hurting. I am unable to wear a shoe.    Subjective: 57 y.o. female presenting to the office today for new complaint of pain and tenderness associated to the left fifth digit where she has a symptomatic skin lesion.  Is very tender and painful.  She is tried to file it down with minimal improvement.  Patient also has pain and tenderness along the medial aspect of the left foot.  She says that it is swollen in that area and symptomatic.  She does wear a good pair of Brooks running shoes which seems to alleviate some of that pain.  Idiopathic onset.  No history of injury.   Past Medical History:  Diagnosis Date   Allergy    Cardiomegaly    DVT (deep venous thrombosis) (HCC)    history of - Right arm   Facial droop    left side (from possible old stroke(?))   GERD (gastroesophageal reflux disease)    Hypertension    Leaky heart valve    Lupus    Motion sickness    ocean ships   Orthodontics    wears braces   Pre-diabetes    Sleep apnea    CPAP   Urethral stricture    Wears contact lenses     Past Surgical History:  Procedure Laterality Date   CESAREAN SECTION     COLONOSCOPY WITH PROPOFOL N/A 11/04/2016   Procedure: COLONOSCOPY WITH PROPOFOL;  Surgeon: Midge Minium, MD;  Location: [facility];  Service: Gastroenterology;  Laterality: N/A;   CYST EXCISION     biopsy - neck   VULVA /PERINEUM BIOPSY      Allergies  Allergen Reactions   Beef Allergy Anaphylaxis    Due to tick bite   Ciprofloxacin Swelling   Pork Allergy Anaphylaxis   Chicken Allergy Other (See Comments)    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 Pain on palpation with a central nucleated core noted. Skin is warm, dry and supple bilateral lower extremities. Negative for open lesions or macerations.  Vascular: Palpable pedal pulses bilaterally. No edema or erythema noted. Capillary refill within normal limits.  Neurological: Grossly intact via light touch  Musculoskeletal Exam: Pain on palpation at the keratotic lesion(s) noted.  There is also a palpable soft tissue mass deep within the tissues of the medial aspect of the foot around the abductor hallucis muscle belly and distal portion of the plantar fascia.  Tenderness with palpation.  Assessment: 1.  Symptomatic benign skin lesion left fifth digit 2.  Soft tissue mass along the medial longitudinal aspect of the left arch of the foot/plantar fasciitis will need left   Plan of Care:  -Patient evaluated -Excisional debridement of keratoic lesion(s) using a chisel blade was performed without incident.  -Salicylic acid applied with a bandaid -Injection of 0.5 cc Celestone Soluspan injected  along the plantar fascia left. -Prescription for meloxicam 15 mg daily -Continue wearing good supportive shoes and sneakers -Return to the clinic PRN.  If there is no improvement with the injection and she continues to have pain since there is a palpable soft tissue mass within the deeper soft tissue of the foot along the medial aspect I would recommend MRI.  She will contact our office if she would like to pursue an MRI and we will follow-up after MRI to review the results and discuss further treatment options  Felecia Shelling, DPM [facility]  Dr. Felecia Shelling, DPM    [address] [place], [place] [address]                Office [phone]   Fax [phone]

## 2023-02-24 ENCOUNTER — Ambulatory Visit: Payer: 59 | Admitting: Podiatry

## 2023-02-24 ENCOUNTER — Emergency Department
Admission: EM | Admit: 2023-02-24 | Discharge: 2023-02-24 | Disposition: A | Discharge status: Home / Self Care | Payer: 59 | Attending: Emergency Medicine | Admitting: Emergency Medicine

## 2023-02-24 ENCOUNTER — Other Ambulatory Visit: Payer: Self-pay

## 2023-02-24 ENCOUNTER — Emergency Department: Payer: 59

## 2023-02-24 DIAGNOSIS — Z20822 Contact with and (suspected) exposure to covid-19: Secondary | ICD-10-CM | POA: Insufficient documentation

## 2023-02-24 DIAGNOSIS — I10 Essential (primary) hypertension: Secondary | ICD-10-CM | POA: Diagnosis not present

## 2023-02-24 DIAGNOSIS — R0981 Nasal congestion: Secondary | ICD-10-CM | POA: Diagnosis present

## 2023-02-24 DIAGNOSIS — J210 Acute bronchiolitis due to respiratory syncytial virus: Secondary | ICD-10-CM | POA: Diagnosis not present

## 2023-02-24 LAB — RESP PANEL BY RT-PCR (RSV, FLU A&B, COVID)  RVPGX2
Influenza A by PCR: NEGATIVE
Influenza B by PCR: NEGATIVE
Resp Syncytial Virus by PCR: POSITIVE — AB
SARS Coronavirus 2 by RT PCR: NEGATIVE

## 2023-02-24 NOTE — Discharge Instructions (Signed)
Your swab is positive for RSV.  You do not have evidence of pneumonia on your chest x-ray.  This is a virus that is highly contagious to others.  Please take Tylenol/ibuprofen per package instructions as needed for your symptoms.  Please return for any new, worsening, or change in symptoms or other concerns.  It was a pleasure caring for you today.

## 2023-02-24 NOTE — ED Triage Notes (Signed)
Pt has a stuffy nose, cough and chest congestion for the past 2-3 days. Pt states that she did an at home covid swab and it was negative

## 2023-02-24 NOTE — ED Provider Notes (Signed)
[facility] Provider Note    Event Date/Time   First MD Initiated Contact with Patient 02/24/23 1217     (approximate)   History   Cough, chest congestion, and Chest Pain   HPI  Jacqueline [name] is a 57 y.o. female with a past medical history of obesity, lupus, hypertension who presents today for evaluation of runny nose, cough, chest congestion for the past 2 to 3 days.  She reports that a family member is sick with similar symptoms.  She denies fevers or chills.  No chest pain or shortness of breath.  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          Physical Exam   Triage Vital Signs: ED Triage Vitals  Encounter Vitals Group     BP 02/24/23 1117 (!) 160/78     Systolic BP Percentile --      Diastolic BP Percentile --      Pulse Rate 02/24/23 1117 80     Resp 02/24/23 1117 16     Temp 02/24/23 1117 98.8 F (37.1 C)     Temp Source 02/24/23 1117 Oral     SpO2 02/24/23 1117 98 %     Weight 02/24/23 1115 230 lb (104.3 kg)     Height 02/24/23 1115 5\' 7"  (1.702 m)     Head Circumference --      Peak Flow --      Pain Score 02/24/23 1118 4     Pain Loc --      Pain Education --      Exclude from Growth Chart --     Most recent vital signs: Vitals:   02/24/23 1117  BP: (!) 160/78  Pulse: 80  Resp: 16  Temp: 98.8 F (37.1 C)   SpO2: 98%    Physical Exam Vitals and nursing note reviewed.  Constitutional:      General: Awake and alert. No acute distress.    Appearance: Normal appearance. The patient is obese.  HENT:     Head: Normocephalic and atraumatic.     Mouth: Mucous membranes are moist.  Nasal congestion present Eyes:     General: PERRL. Normal EOMs        Right eye: No discharge.        Left eye: No discharge.     Conjunctiva/sclera: Conjunctivae normal.  Cardiovascular:     Rate and Rhythm: Normal rate and regular rhythm.     Pulses: Normal pulses.  Pulmonary:     Effort: Pulmonary effort is normal. No respiratory distress.  No accessory muscle use.  Able to speak easily in complete sentences    Breath sounds: Normal breath sounds.  Abdominal:     Abdomen is soft. There is no abdominal tenderness. No rebound or guarding. No distention. Musculoskeletal:        General: No swelling. Normal range of motion.  Cervical back: Normal range of motion and neck supple.  Skin:    General: Skin is warm and dry.     Capillary Refill: Capillary refill takes less than 2 seconds.     Findings: No rash.  Neurological:     Mental Status: The patient is awake and alert.      ED Results / Procedures / Treatments   Labs (all labs ordered are listed, but only abnormal results are displayed) Labs Reviewed  RESP PANEL BY RT-PCR (RSV, FLU A&B, COVID)  RVPGX2 - Abnormal; Notable for the following components:      Result Value   Resp Syncytial Virus by PCR POSITIVE (*)    All other components within normal limits     EKG     RADIOLOGY I independently reviewed and interpreted imaging and agree with radiologists findings.     PROCEDURES:  Critical Care performed:   Procedures   MEDICATIONS ORDERED IN ED: Medications - No data to display   IMPRESSION / MDM / ASSESSMENT AND PLAN / ED COURSE  I reviewed the triage vital signs and the nursing notes.   Differential diagnosis includes,  but is not limited to, RSV, COVID, influenza, bronchitis, pneumonia.  Patient is awake and alert, hemodynamically stable and afebrile.  She is nontoxic in appearance.  COVID/flu/RSV swab obtained in triage is positive for RSV.  This result was discussed with the patient.  Given that she is immunocompromised because of her lupus and immunocompromising medications, x-ray was obtained for evaluation of concurrent pneumonia, though this was negative.  Patient is reassured by this.  We discussed that RSV is highly contagious to others.  Recommend symptomatic management and return precautions.  Patient understands and agrees with plan.  She was discharged in stable condition.   Patient's presentation is most consistent with acute complicated illness / injury requiring diagnostic workup.    FINAL CLINICAL IMPRESSION(S) / ED DIAGNOSES   Final diagnoses:  RSV (acute bronchiolitis due to respiratory syncytial virus)     Rx / DC Orders   ED Discharge Orders     None        Note:  This document was prepared using Dragon voice recognition software and may include unintentional dictation errors.   Jackelyn Hoehn, PA-C 02/24/23 1556    Shaune Pollack, MD 02/25/23 [phone]

## 2023-03-01 ENCOUNTER — Other Ambulatory Visit: Payer: Self-pay

## 2023-03-01 ENCOUNTER — Emergency Department: Payer: 59

## 2023-03-01 ENCOUNTER — Emergency Department
Admission: EM | Admit: 2023-03-01 | Discharge: 2023-03-01 | Disposition: A | Discharge status: Home / Self Care | Payer: 59 | Attending: Emergency Medicine | Admitting: Emergency Medicine

## 2023-03-01 DIAGNOSIS — I10 Essential (primary) hypertension: Secondary | ICD-10-CM | POA: Insufficient documentation

## 2023-03-01 DIAGNOSIS — J189 Pneumonia, unspecified organism: Secondary | ICD-10-CM | POA: Diagnosis not present

## 2023-03-01 DIAGNOSIS — R079 Chest pain, unspecified: Secondary | ICD-10-CM | POA: Diagnosis present

## 2023-03-01 DIAGNOSIS — Z79899 Other long term (current) drug therapy: Secondary | ICD-10-CM | POA: Diagnosis not present

## 2023-03-01 LAB — CBC
HCT: 37.7 % (ref 36.0–46.0)
Hemoglobin: 12.4 g/dL (ref 12.0–15.0)
MCH: 28.8 pg (ref 26.0–34.0)
MCHC: 32.9 g/dL (ref 30.0–36.0)
MCV: 87.7 fL (ref 80.0–100.0)
Platelets: 196 10*3/uL (ref 150–400)
RBC: 4.3 MIL/uL (ref 3.87–5.11)
RDW: 13.2 % (ref 11.5–15.5)
WBC: 9.9 10*3/uL (ref 4.0–10.5)
nRBC: 0 % (ref 0.0–0.2)

## 2023-03-01 LAB — BASIC METABOLIC PANEL
Anion gap: 9 (ref 5–15)
BUN: 9 mg/dL (ref 6–20)
CO2: 25 mmol/L (ref 22–32)
Calcium: 8.5 mg/dL — ABNORMAL LOW (ref 8.9–10.3)
Chloride: 102 mmol/L (ref 98–111)
Creatinine, Ser: 0.82 mg/dL (ref 0.44–1.00)
GFR, Estimated: >60 mL/min (ref >60)
Glucose, Bld: 131 mg/dL — ABNORMAL HIGH (ref 70–99)
Potassium: 3.8 mmol/L (ref 3.5–5.1)
Sodium: 136 mmol/L (ref 135–145)

## 2023-03-01 LAB — TROPONIN I (HIGH SENSITIVITY)
Troponin I (High Sensitivity): 9 ng/L (ref <18)
Troponin I (High Sensitivity): 9 ng/L (ref <18)

## 2023-03-01 LAB — D-DIMER, QUANTITATIVE: D-Dimer, Quant: 1.5 ug{FEU}/mL — ABNORMAL HIGH (ref 0.00–0.50)

## 2023-03-01 LAB — LACTIC ACID, PLASMA: Lactic Acid, Venous: 1 mmol/L (ref 0.5–1.9)

## 2023-03-01 MED ORDER — SODIUM CHLORIDE 0.9 % IV SOLN
2.0000 g | Freq: Once | INTRAVENOUS | Status: AC
  Administered 2023-03-01: 2 g via INTRAVENOUS
  Filled 2023-03-01: qty 20

## 2023-03-01 MED ORDER — CEFDINIR 300 MG PO CAPS: 300.0000 mg | ORAL_CAPSULE | Freq: Two times a day (BID) | ORAL | 0 refills | Status: AC

## 2023-03-01 MED ORDER — BENZONATATE 100 MG PO CAPS: 100.0000 mg | ORAL_CAPSULE | Freq: Three times a day (TID) | ORAL | 0 refills | Status: AC | PRN

## 2023-03-01 MED ORDER — SODIUM CHLORIDE 0.9 % IV SOLN
500.0000 mg | Freq: Once | INTRAVENOUS | Status: AC
  Administered 2023-03-01: 500 mg via INTRAVENOUS
  Filled 2023-03-01: qty 5

## 2023-03-01 MED ORDER — KETOROLAC TROMETHAMINE 30 MG/ML IJ SOLN
30.0000 mg | Freq: Once | INTRAMUSCULAR | Status: AC
  Administered 2023-03-01: 30 mg via INTRAVENOUS
  Filled 2023-03-01: qty 1

## 2023-03-01 MED ORDER — SODIUM CHLORIDE 0.9 % IV BOLUS (SEPSIS)
1000.0000 mL | Freq: Once | INTRAVENOUS | Status: AC
  Administered 2023-03-01: 1000 mL via INTRAVENOUS

## 2023-03-01 MED ORDER — IOHEXOL 350 MG/ML SOLN
100.0000 mL | Freq: Once | INTRAVENOUS | Status: AC | PRN
  Administered 2023-03-01: 100 mL via INTRAVENOUS

## 2023-03-01 MED ORDER — ACETAMINOPHEN 325 MG PO TABS
650.0000 mg | ORAL_TABLET | Freq: Once | ORAL | Status: AC | PRN
  Administered 2023-03-01: 650 mg via ORAL
  Filled 2023-03-01: qty 2

## 2023-03-01 MED ORDER — AZITHROMYCIN 250 MG PO TABS: ORAL_TABLET | ORAL | 0 refills | Status: AC

## 2023-03-01 NOTE — ED Provider Notes (Signed)
[facility] Provider Note    Event Date/Time   First MD Initiated Contact with Patient 03/01/23 [phone]     (approximate)   History   Chest Pain   HPI  Jacqueline [name] is a 57 y.o. female with history of lupus, DVT in the right upper extremity not on anticoagulation, hypertension who presents to the emergency department with right-sided chest pain, shortness of breath.  Was just diagnosed with RSV several days ago.  Continues to have fevers.  No vomiting, diarrhea.  Has never had a PE before.  Pain is worse with deep inspiration, movement, palpation.   History provided by patient, family.    Past Medical History:  Diagnosis Date   Allergy    Cardiomegaly    DVT (deep venous thrombosis) (HCC)    history of - Right arm   Facial droop    left side (from possible old stroke(?))   GERD (gastroesophageal reflux disease)    Hypertension    Leaky heart valve    Lupus    Motion sickness    ocean ships   Orthodontics    wears braces   Pre-diabetes    Sleep apnea    CPAP   Urethral stricture    Wears contact lenses     Past Surgical History:  Procedure Laterality Date   CESAREAN SECTION     COLONOSCOPY WITH PROPOFOL N/A 11/04/2016   Procedure: COLONOSCOPY WITH PROPOFOL;  Surgeon: Midge Minium, MD;  Location: [facility];  Service: Gastroenterology;  Laterality: N/A;   CYST EXCISION     biopsy - neck   VULVA /PERINEUM BIOPSY      MEDICATIONS:  Prior to Admission medications   Medication Sig Start Date End Date Taking? Authorizing Provider  diphenhydrAMINE (BENADRYL) 25 mg capsule Take 25 mg by mouth.    [provider]  EPINEPHrine 0.3 mg/0.3 mL IJ SOAJ injection Inject into the muscle. 01/06/16   [provider]  HYDROcodone bit-homatropine (HYCODAN) 5-1.5 MG/5ML syrup Take 5 mLs by mouth every 6 (six) hours as needed for cough. 09/18/21   Shaune Pollack, MD  Hydroxychloroquine Sulfate (PLAQUENIL PO) Take by mouth.     [provider]  ibuprofen (ADVIL) 600 MG tablet As needed 03/28/13   [provider]  meloxicam (MOBIC) 15 MG tablet Take 1 tablet (15 mg total) by mouth daily. 02/17/23   Felecia Shelling, DPM  metoprolol tartrate (LOPRESSOR) 25 MG tablet Take by mouth. 11/01/10   [provider]  PROAIR RESPICLICK [phone] Base) MCG/ACT AEPB SMARTSIG:2 Puff(s) By Mouth Every 6 Hours PRN 09/15/19   [provider]  terbinafine (LAMISIL) 250 MG tablet Take 1 tablet (250 mg total) by mouth daily. 11/12/19   Felecia Shelling, DPM    Physical Exam   Triage Vital Signs: ED Triage Vitals [03/01/23 0044]  Encounter Vitals Group     BP (!) 150/71     Systolic BP Percentile      Diastolic BP Percentile      Pulse Rate (!) 102     Resp 18     Temp (!) 101 F (38.3 C)     Temp Source Oral     SpO2 100 %     Weight      Height      Head Circumference      Peak Flow      Pain Score 7     Pain Loc      Pain  Education      Exclude from Growth Chart     Most recent vital signs: Vitals:   03/01/23 0500 03/01/23 0510  BP:  (!) 141/57  Pulse: 85 85  Resp:  (!) 33  Temp:    SpO2: 100% 100%    CONSTITUTIONAL: Alert, responds appropriately to questions. Well-appearing; well-nourished HEAD: Normocephalic, atraumatic EYES: Conjunctivae clear, pupils appear equal, sclera nonicteric ENT: normal nose; moist mucous membranes NECK: Supple, normal ROM CARD: RRR; S1 and S2 appreciated, right chest wall is tender to palpation without crepitus or deformity RESP: Normal chest excursion without splinting or tachypnea; breath sounds clear and equal bilaterally; no wheezes, no rhonchi, no rales, no hypoxia or respiratory distress, speaking full sentences ABD/GI: Non-distended; soft, non-tender, no rebound, no guarding, no peritoneal signs BACK: The back appears normal EXT: Normal ROM in all joints; no deformity noted, no edema, no calf tenderness or calf swelling SKIN: Normal color for age  and race; warm; no rash on exposed skin NEURO: Moves all extremities equally, normal speech PSYCH: The patient's mood and manner are appropriate.   ED Results / Procedures / Treatments   LABS: (all labs ordered are listed, but only abnormal results are displayed) Labs Reviewed  BASIC METABOLIC PANEL - Abnormal; Notable for the following components:      Result Value   Glucose, Bld 131 (*)    Calcium 8.5 (*)    All other components within normal limits  D-DIMER, QUANTITATIVE - Abnormal; Notable for the following components:   D-Dimer, Quant 1.50 (*)    All other components within normal limits  CBC  LACTIC ACID, PLASMA  TROPONIN I (HIGH SENSITIVITY)  TROPONIN I (HIGH SENSITIVITY)     EKG:  EKG Interpretation Date/Time:  Wednesday March 01 2023 00:39:36 EST Ventricular Rate:  101 PR Interval:  162 QRS Duration:  78 QT Interval:  328 QTC Calculation: 425 R Axis:   25  Text Interpretation: Sinus tachycardia Anterior infarct (cited on or before 18-Sep-2021) Abnormal ECG When compared with ECG of 24-Feb-2023 11:14, No significant change was found Confirmed by Rochele Raring [phone]) on 03/01/2023 6:01:17 AM         RADIOLOGY: My personal review and interpretation of imaging: Chest x-ray shows bibasilar atelectasis versus viral infiltrate.  I have personally reviewed all radiology reports.   CT Angio Chest PE W and/or Wo Contrast Result Date: 03/01/2023 CLINICAL DATA:  57 year old female with history of right-sided chest pain radiating into the right arm. Suspected pulmonary embolism. EXAM: CT ANGIOGRAPHY CHEST WITH CONTRAST TECHNIQUE: Multidetector CT imaging of the chest was performed using the standard protocol during bolus administration of intravenous contrast. Multiplanar CT image reconstructions and MIPs were obtained to evaluate the vascular anatomy. RADIATION DOSE REDUCTION: This exam was performed according to the departmental dose-optimization program which  includes automated exposure control, adjustment of the mA and/or kV according to patient size and/or use of iterative reconstruction technique. CONTRAST:  OMNIPAQUE IOHEXOL 350 MG/ML SOLN COMPARISON:  Chest CTA 04/06/2019. FINDINGS: Cardiovascular: No filling defects are noted in the pulmonary arterial tree to suggest pulmonary embolism. Heart size is borderline enlarged with concentric left ventricular hypertrophy. There is no significant pericardial fluid, thickening or pericardial calcification. No atherosclerotic calcifications are noted in the thoracic aorta or the coronary arteries. Mediastinum/Nodes: Numerous prominent borderline enlarged mediastinal and bilateral hilar lymph nodes are noted, measuring up to 1.4 cm in short axis in the right paratracheal nodal station, similar to the prior study. Esophagus is unremarkable in  appearance. Extensive bilateral axillary and subpectoral lymphadenopathy again noted, similar but progressive when compared to the prior examination, with the largest right subpectoral lymph node measuring up to 2.4 cm in short axis (axial image 18 of series 5). Lungs/Pleura: Patchy multifocal peribronchovascular airspace consolidation is noted in the lungs bilaterally, most severe in the dependent portions of the lower lobes of the lungs, but also involving portions of the left upper lobe and to a lesser extent the inferior aspect of the right upper lobe. No pleural effusions. No definite suspicious appearing pulmonary nodules or masses are noted. Upper Abdomen: Unremarkable. Musculoskeletal: There are no aggressive appearing lytic or blastic lesions noted in the visualized portions of the skeleton. Review of the MIP images confirms the above findings. IMPRESSION: 1. No evidence of pulmonary embolism. 2. Multilobar bilateral bronchopneumonia, as above. Given the predominantly dependent nature of the findings, clinical correlation for signs and symptoms of aspiration pneumonia is  suggested. 3. Extensive lymphadenopathy redemonstrated, most pronounced in the axillary and subpectoral regions bilaterally, but also involving the mediastinal and bilateral hilar nodal stations. This is longstanding and was present in 2021. Clinical correlation for signs and symptoms of lymphoproliferative disorder or a systemic disease such as sarcoidosis is recommended. Electronically Signed   By: Trudie Reed M.D.   On: 03/01/2023 07:06   DG Chest 2 View Result Date: 03/01/2023 CLINICAL DATA:  Right-sided chest pain. EXAM: CHEST - 2 VIEW COMPARISON:  February 24, 2023 FINDINGS: The heart size and mediastinal contours are within normal limits. Low lung volumes are noted with mild areas of atelectasis and/or infiltrate seen within the bilateral lung bases, left slightly greater than right. No pleural effusion or pneumothorax is identified. The visualized skeletal structures are unremarkable. IMPRESSION: Low lung volumes with mild bibasilar atelectasis and/or infiltrate. Electronically Signed   By: Aram Candela M.D.   On: 03/01/2023 01:12     PROCEDURES:  Critical Care performed: No     .1-3 Lead EKG Interpretation  Performed by: Tiffancy Moger, Layla Maw, DO Authorized by: Vitoria Conyer, Layla Maw, DO     Interpretation: normal     ECG rate:  85   ECG rate assessment: normal     Rhythm: sinus rhythm     Ectopy: none     Conduction: normal       IMPRESSION / MDM / ASSESSMENT AND PLAN / ED COURSE  I reviewed the triage vital signs and the nursing notes.    Patient here with right-sided chest pain.  Recently diagnosed with RSV.  Also has history of lupus and previous DVT not on anticoagulation.  The patient is on the cardiac monitor to evaluate for evidence of arrhythmia and/or significant heart rate changes.   DIFFERENTIAL DIAGNOSIS (includes but not limited to):   Viral pneumonia, pleurisy, chest wall pain from coughing, bacterial pneumonia, pneumothorax, PE, less likely  ACS   Patient's presentation is most consistent with acute presentation with potential threat to life or bodily function.   PLAN: Lab work initiated from triage.  No leukocytosis, normal hemoglobin.  Normal electrolytes.  Troponin negative.  Lactic normal.  Will obtain second troponin, D-dimer.  Chest x-ray reviewed and interpreted by myself and the radiologist and shows bibasilar atelectasis versus viral infiltrate.  No hypoxia or increased work of breathing.  Will give Toradol for pain.   MEDICATIONS GIVEN IN ED: Medications  cefTRIAXone (ROCEPHIN) 2 g in sodium chloride 0.9 % 100 mL IVPB (has no administration in time range)  azithromycin (ZITHROMAX) 500 mg in  sodium chloride 0.9 % 250 mL IVPB (has no administration in time range)  acetaminophen (TYLENOL) tablet 650 mg (650 mg Oral Given 03/01/23 0050)  ketorolac (TORADOL) 30 MG/ML injection 30 mg (30 mg Intravenous Given 03/01/23 0633)  sodium chloride 0.9 % bolus 1,000 mL (1,000 mLs Intravenous New Bag/Given 03/01/23 0634)  iohexol (OMNIPAQUE) 350 MG/ML injection 100 mL (100 mLs Intravenous Contrast Given 03/01/23 0648)     ED COURSE: Second troponin negative.  D-dimer elevated.  Will obtain CTA of the chest.  7:10 AM  Pt's CTA chest shows no PE but does show multilobar pneumonia.  She has had some intermittent mild tachypnea but no hypoxia or increased work of breathing.  Have offered admission to the hospital however patient would like to go home on antibiotics.  Will give Rocephin and azithromycin here for coverage for community-acquired bacterial pneumonia.  This could also be pneumonia from RSV.  Recommended Tylenol, Motrin for discomfort.  Discussed return precautions.  Will discharge on oral antibiotics.   At this time, I do not feel there is any life-threatening condition present. I reviewed all nursing notes, vitals, pertinent previous records.  All lab and urine results, EKGs, imaging ordered have been independently reviewed  and interpreted by myself.  I reviewed all available radiology reports from any imaging ordered this visit.  Based on my assessment, I feel the patient is safe to be discharged home without further emergent workup and can continue workup as an outpatient as needed. Discussed all findings, treatment plan as well as usual and customary return precautions.  They verbalize understanding and are comfortable with this plan.  Outpatient follow-up has been provided as needed.  All questions have been answered.    CONSULTS: Admission offered however patient declined stating she wanted to go home.   OUTSIDE RECORDS REVIEWED: Reviewed last pulmonology note at [facility] on 01/16/2023.       FINAL CLINICAL IMPRESSION(S) / ED DIAGNOSES   Final diagnoses:  Right-sided chest pain  Multifocal pneumonia     Rx / DC Orders   ED Discharge Orders          Ordered    cefdinir (OMNICEF) 300 MG capsule  2 times daily        03/01/23 0713    azithromycin (ZITHROMAX Z-PAK) 250 MG tablet        03/01/23 0713    benzonatate (TESSALON PERLES) 100 MG capsule  3 times daily PRN        03/01/23 2841             Note:  This document was prepared using Dragon voice recognition software and may include unintentional dictation errors.   Aston Lieske, Layla Maw, DO 03/01/23 [phone]

## 2023-03-01 NOTE — Discharge Instructions (Addendum)
You may alternate Tylenol 1000 mg every 6 hours as needed for pain, fever and Ibuprofen 800 mg every 6-8 hours as needed for pain, fever.  Please take Ibuprofen with food.  Do not take more than 4000 mg of Tylenol (acetaminophen) in a 24 hour period. ° °

## 2023-03-01 NOTE — ED Triage Notes (Signed)
Pt to ED via POV c/o right sided CP that radiated down right arm. States it started about an hour ago. Pt recently positive for RSV. Pt endorses some SHOB

## 2023-03-06 ENCOUNTER — Ambulatory Visit
Admission: RE | Admit: 2023-03-06 | Discharge: 2023-03-06 | Disposition: A | Discharge status: Home / Self Care | Payer: 59 | Source: Ambulatory Visit | Attending: Licensed Practical Nurse | Admitting: Licensed Practical Nurse

## 2023-03-06 DIAGNOSIS — Z1231 Encounter for screening mammogram for malignant neoplasm of breast: Secondary | ICD-10-CM | POA: Diagnosis present

## 2023-12-20 ENCOUNTER — Other Ambulatory Visit: Payer: Self-pay | Admitting: Licensed Practical Nurse

## 2023-12-20 DIAGNOSIS — Z1231 Encounter for screening mammogram for malignant neoplasm of breast: Secondary | ICD-10-CM

## 2024-03-04 ENCOUNTER — Encounter: Payer: Self-pay | Admitting: Obstetrics

## 2024-03-04 ENCOUNTER — Ambulatory Visit (INDEPENDENT_AMBULATORY_CARE_PROVIDER_SITE_OTHER): Admitting: Obstetrics

## 2024-03-04 VITALS — BP 142/77 | HR 72 | Ht 68.0 in | Wt 237.0 lb

## 2024-03-04 DIAGNOSIS — Z01419 Encounter for gynecological examination (general) (routine) without abnormal findings: Secondary | ICD-10-CM

## 2024-03-04 DIAGNOSIS — Z1231 Encounter for screening mammogram for malignant neoplasm of breast: Secondary | ICD-10-CM

## 2024-03-04 DIAGNOSIS — Z113 Encounter for screening for infections with a predominantly sexual mode of transmission: Secondary | ICD-10-CM

## 2024-03-04 DIAGNOSIS — Z124 Encounter for screening for malignant neoplasm of cervix: Secondary | ICD-10-CM

## 2024-03-04 NOTE — Progress Notes (Signed)
 "  ANNUAL GYNECOLOGICAL EXAM  SUBJECTIVE  HPI  Jacqueline [name] is a 58 y.o.-year-old [id number] who presents for an annual gynecological exam today.  She denies pelvic pain, dyspareunia, abnormal vaginal bleeding or discharge, and UTI symptoms. She would like to have a Pap smear. She has not been sexually active in 7 years.   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(?))   GERD (gastroesophageal reflux disease)    Hypertension    Leaky heart valve    Lupus    Motion sickness    ocean ships   Orthodontics    wears braces   Pre-diabetes    Sleep apnea    CPAP   Urethral stricture    Wears contact lenses    Past Surgical History:  Procedure Laterality Date   CESAREAN SECTION     COLONOSCOPY WITH PROPOFOL  N/A 11/04/2016   Procedure: COLONOSCOPY WITH PROPOFOL ;  Surgeon: Jinny Carmine, MD;  Location: [facility];  Service: Gastroenterology;  Laterality: N/A;   CYST EXCISION     biopsy - neck   VULVA /PERINEUM BIOPSY      Social History Lives with husband . Feels safe there Work: [employer]  Exercise: a little bit Substances:  denies EtOH, tobacco, vape, and recreational drugs  Obstetric History OB History     Gravida  3   Para  2   Term  2   Preterm      AB  1   Living  2      SAB      IAB      Ectopic      Multiple      Live Births  2            GYN/Menstrual History No LMP recorded. Patient is postmenopausal.  Last Pap:10/01/21 Contraception: postmenopausal   Prevention Dentist utd Eye exam utd Mammogram scheduled 03/06/2024 Colonoscopy utd  Flu shot/vaccines utd   Current Medications Show/hide medication list[1]      ROS Constitutional: Denied constitutional symptoms, night sweats, recent illness, fatigue, fever, insomnia and weight loss.  Eyes: Denied eye  symptoms, eye pain, photophobia, vision change and visual disturbance.  Ears/Nose/Throat/Neck: Denied ear, nose, throat or neck symptoms, hearing loss, nasal discharge, sinus congestion and sore throat.  Cardiovascular: Denied cardiovascular symptoms, arrhythmia, chest pain/pressure, edema, exercise intolerance, orthopnea and palpitations.  Respiratory: Denied pulmonary symptoms, asthma, pleuritic pain, productive sputum, cough, dyspnea and wheezing.  Gastrointestinal: Denied gastro-esophageal reflux, melena, nausea and vomiting.  Genitourinary: Denied genitourinary symptoms including symptomatic vaginal discharge, pelvic relaxation issues, and urinary complaints.  Musculoskeletal: Denied musculoskeletal symptoms, stiffness, swelling, muscle weakness and myalgia.  Dermatologic: Denied dermatology symptoms, rash and scar.  Neurologic: Denied neurology symptoms, dizziness, headache, neck pain and syncope.  Psychiatric: Denied psychiatric symptoms, anxiety and depression.  Endocrine: Denied endocrine symptoms including hot flashes and night sweats.    OBJECTIVE  BP (!) 142/77   Pulse 72   Ht 5' 8 (1.727 m)   Wt 237 lb (107.5 kg)   BMI 36.04 kg/m    Physical examination General NAD, Conversant  HEENT Atraumatic; Op clear with mmm.  Normo-cephalic. Pupils reactive. Anicteric sclerae  Thyroid /Neck Smooth without nodularity or enlargement. Normal ROM.  Neck Supple.  Skin No rashes, lesions or ulceration. Normal palpated skin  turgor. No nodularity.  Breasts: No masses or discharge.  Symmetric.  No axillary adenopathy.  Lungs: Clear to auscultation.No rales or wheezes. Normal Respiratory effort, no retractions.  Heart: NSR.  No murmurs or rubs appreciated. No peripheral edema  Abdomen: Soft.  Non-tender.  No masses.  No HSM. No hernia  Extremities: Moves all appropriately.  Normal ROM for age. No lymphadenopathy.  Neuro: Oriented to PPT.  Normal mood. Normal affect.     Pelvic:   Vulva:  Normal appearance.  No lesions.  Vagina: No lesions or abnormalities noted.Atrophic changes of menopause.  Support: Normal pelvic support.  Urethra No masses tenderness or scarring.  Meatus Normal size without lesions or prolapse.  Cervix: Normal appearance.  No lesions.Pap collected.  Perineum: Normal exam.  No lesions.    ASSESSMENT  1) Annual exam 2) Pap smear  PLAN 1) Physical exam as noted. Discussed healthy lifestyle choices and preventive care. STI testing today. Pap collected. Routine labs done with PCP. Elevated BP today - she forget to take her meds today. HTN managed by PCP 2) Pap collected. Discussed ASCCP recommendations  Return in one year for annual exam or as needed for concerns.   Eleanor Canny, CNM      [1]  Outpatient Medications Prior to Visit  Medication Sig   cefdinir  (OMNICEF ) 300 MG capsule Take 1 capsule (300 mg total) by mouth 2 (two) times daily.   clindamycin (CLEOCIN T) 1 % lotion APPLY EXTERNALLY TO THE AFFECTED AREA EVERY DAY AS NEEDED   diphenhydrAMINE  (BENADRYL ) 25 mg capsule Take 25 mg by mouth.   EPINEPHrine  0.3 mg/0.3 mL IJ SOAJ injection Inject into the muscle.   Fluticasone-Umeclidin-Vilant (TRELEGY ELLIPTA) 100-62.5-25 MCG/ACT AEPB Inhale 1 puff into the lungs.   HYDROcodone  bit-homatropine (HYCODAN) 5-1.5 MG/5ML syrup Take 5 mLs by mouth every 6 (six) hours as needed for cough.   Hydroxychloroquine Sulfate (PLAQUENIL PO) Take by mouth.   ibuprofen (ADVIL) 600 MG tablet As needed   meloxicam  (MOBIC ) 15 MG tablet Take 1 tablet (15 mg total) by mouth daily.   METFORMIN & DIET MANAGE PROD PO Take 1 tablet by mouth 2 (two) times daily.   metoprolol tartrate (LOPRESSOR) 25 MG tablet Take by mouth.   omeprazole (PRILOSEC) 20 MG capsule Take 40 mg by mouth.   PROAIR RESPICLICK 108 (90 Base) MCG/ACT AEPB SMARTSIG:2 Puff(s) By Mouth Every 6 Hours PRN   terbinafine  (LAMISIL ) 250 MG tablet Take 1 tablet (250 mg total) by mouth daily.   No  facility-administered medications prior to visit.   "

## 2024-03-05 LAB — HEP, RPR, HIV PANEL
HIV Screen 4th Generation wRfx: NONREACTIVE
Hepatitis B Surface Ag: NEGATIVE
RPR Ser Ql: NONREACTIVE

## 2024-03-06 ENCOUNTER — Ambulatory Visit
Admission: RE | Admit: 2024-03-06 | Discharge: 2024-03-06 | Disposition: A | Discharge status: Home / Self Care | Source: Ambulatory Visit | Attending: Licensed Practical Nurse | Admitting: Licensed Practical Nurse

## 2024-03-06 DIAGNOSIS — Z1231 Encounter for screening mammogram for malignant neoplasm of breast: Secondary | ICD-10-CM | POA: Insufficient documentation

## 2024-03-09 LAB — IGP,CTNGTV,RFX APT HPV ALL
Chlamydia, Nuc. Acid Amp: NEGATIVE
Gonococcus, Nuc. Acid Amp: NEGATIVE
Trich vag by NAA: NEGATIVE

## 2024-03-09 LAB — HPV APTIMA: HPV Aptima: NEGATIVE

## 2024-03-10 ENCOUNTER — Ambulatory Visit: Payer: Self-pay | Admitting: Obstetrics

## 2024-03-16 ENCOUNTER — Encounter: Payer: Self-pay | Admitting: Licensed Practical Nurse
